# Patient Record
Sex: Female | Born: 1965 | Race: White | Hispanic: No | Marital: Married | Smoking: Never smoker
Health system: Southern US, Community
[De-identification: ages and names within clinical notes are randomized; demographics above are authoritative.]

## PROBLEM LIST (undated history)

## (undated) DIAGNOSIS — I10 Essential (primary) hypertension: Secondary | ICD-10-CM

## (undated) DIAGNOSIS — F419 Anxiety disorder, unspecified: Secondary | ICD-10-CM

## (undated) DIAGNOSIS — J309 Allergic rhinitis, unspecified: Secondary | ICD-10-CM

## (undated) DIAGNOSIS — N920 Excessive and frequent menstruation with regular cycle: Secondary | ICD-10-CM

## (undated) DIAGNOSIS — R112 Nausea with vomiting, unspecified: Secondary | ICD-10-CM

## (undated) DIAGNOSIS — L719 Rosacea, unspecified: Secondary | ICD-10-CM

## (undated) DIAGNOSIS — Z8719 Personal history of other diseases of the digestive system: Secondary | ICD-10-CM

## (undated) DIAGNOSIS — M1990 Unspecified osteoarthritis, unspecified site: Secondary | ICD-10-CM

## (undated) DIAGNOSIS — F329 Major depressive disorder, single episode, unspecified: Secondary | ICD-10-CM

## (undated) DIAGNOSIS — D259 Leiomyoma of uterus, unspecified: Secondary | ICD-10-CM

## (undated) DIAGNOSIS — E0500 Thyrotoxicosis with diffuse goiter without thyrotoxic crisis or storm: Secondary | ICD-10-CM

## (undated) DIAGNOSIS — Z9889 Other specified postprocedural states: Secondary | ICD-10-CM

## (undated) DIAGNOSIS — K219 Gastro-esophageal reflux disease without esophagitis: Secondary | ICD-10-CM

## (undated) DIAGNOSIS — K589 Irritable bowel syndrome without diarrhea: Secondary | ICD-10-CM

## (undated) DIAGNOSIS — R030 Elevated blood-pressure reading, without diagnosis of hypertension: Secondary | ICD-10-CM

## (undated) DIAGNOSIS — F32A Depression, unspecified: Secondary | ICD-10-CM

## (undated) DIAGNOSIS — F410 Panic disorder [episodic paroxysmal anxiety] without agoraphobia: Secondary | ICD-10-CM

## (undated) DIAGNOSIS — E039 Hypothyroidism, unspecified: Secondary | ICD-10-CM

## (undated) DIAGNOSIS — G479 Sleep disorder, unspecified: Secondary | ICD-10-CM

## (undated) DIAGNOSIS — B029 Zoster without complications: Secondary | ICD-10-CM

## (undated) DIAGNOSIS — Z973 Presence of spectacles and contact lenses: Secondary | ICD-10-CM

## (undated) DIAGNOSIS — J302 Other seasonal allergic rhinitis: Secondary | ICD-10-CM

## (undated) HISTORY — DX: Panic disorder (episodic paroxysmal anxiety): F41.0

## (undated) HISTORY — PX: TONSILLECTOMY: SUR1361

## (undated) HISTORY — DX: Sleep disorder, unspecified: G47.9

## (undated) HISTORY — DX: Elevated blood-pressure reading, without diagnosis of hypertension: R03.0

## (undated) HISTORY — PX: KNEE SURGERY: SHX244

## (undated) HISTORY — DX: Leiomyoma of uterus, unspecified: D25.9

## (undated) HISTORY — DX: Zoster without complications: B02.9

## (undated) HISTORY — DX: Allergic rhinitis, unspecified: J30.9

## (undated) HISTORY — DX: Irritable bowel syndrome, unspecified: K58.9

## (undated) HISTORY — DX: Rosacea, unspecified: L71.9

## (undated) HISTORY — DX: Excessive and frequent menstruation with regular cycle: N92.0

## (undated) HISTORY — PX: DIAGNOSTIC LAPAROSCOPY: SUR761

---

## 2006-07-09 ENCOUNTER — Emergency Department (HOSPITAL_COMMUNITY): Admission: EM | Admit: 2006-07-09 | Discharge: 2006-07-09 | Payer: Self-pay | Admitting: Emergency Medicine

## 2011-07-12 ENCOUNTER — Ambulatory Visit (HOSPITAL_COMMUNITY): Payer: BC Managed Care – PPO

## 2011-09-03 ENCOUNTER — Other Ambulatory Visit: Payer: Self-pay | Admitting: Orthopaedic Surgery

## 2011-09-03 DIAGNOSIS — M25529 Pain in unspecified elbow: Secondary | ICD-10-CM

## 2011-09-09 ENCOUNTER — Ambulatory Visit
Admission: RE | Admit: 2011-09-09 | Discharge: 2011-09-09 | Disposition: A | Discharge status: Home / Self Care | Payer: BC Managed Care – PPO | Source: Ambulatory Visit | Attending: Orthopaedic Surgery | Admitting: Orthopaedic Surgery

## 2011-09-09 DIAGNOSIS — M25529 Pain in unspecified elbow: Secondary | ICD-10-CM

## 2013-03-16 ENCOUNTER — Other Ambulatory Visit: Payer: Self-pay | Admitting: Obstetrics and Gynecology

## 2013-03-16 DIAGNOSIS — R928 Other abnormal and inconclusive findings on diagnostic imaging of breast: Secondary | ICD-10-CM

## 2013-04-04 ENCOUNTER — Ambulatory Visit
Admission: RE | Admit: 2013-04-04 | Discharge: 2013-04-04 | Disposition: A | Discharge status: Home / Self Care | Payer: BC Managed Care – PPO | Source: Ambulatory Visit | Attending: Obstetrics and Gynecology | Admitting: Obstetrics and Gynecology

## 2013-04-04 DIAGNOSIS — R928 Other abnormal and inconclusive findings on diagnostic imaging of breast: Secondary | ICD-10-CM

## 2015-04-23 ENCOUNTER — Emergency Department (HOSPITAL_COMMUNITY)
Admission: EM | Admit: 2015-04-23 | Discharge: 2015-04-23 | Disposition: A | Discharge status: Home / Self Care | Payer: BC Managed Care – PPO | Attending: Emergency Medicine | Admitting: Emergency Medicine

## 2015-04-23 ENCOUNTER — Emergency Department (HOSPITAL_COMMUNITY): Payer: BC Managed Care – PPO

## 2015-04-23 ENCOUNTER — Encounter (HOSPITAL_COMMUNITY): Payer: Self-pay | Admitting: Emergency Medicine

## 2015-04-23 DIAGNOSIS — Z793 Long term (current) use of hormonal contraceptives: Secondary | ICD-10-CM | POA: Insufficient documentation

## 2015-04-23 DIAGNOSIS — J0110 Acute frontal sinusitis, unspecified: Secondary | ICD-10-CM | POA: Diagnosis not present

## 2015-04-23 DIAGNOSIS — E0500 Thyrotoxicosis with diffuse goiter without thyrotoxic crisis or storm: Secondary | ICD-10-CM | POA: Insufficient documentation

## 2015-04-23 DIAGNOSIS — R079 Chest pain, unspecified: Secondary | ICD-10-CM

## 2015-04-23 DIAGNOSIS — Z79899 Other long term (current) drug therapy: Secondary | ICD-10-CM | POA: Diagnosis not present

## 2015-04-23 HISTORY — DX: Thyrotoxicosis with diffuse goiter without thyrotoxic crisis or storm: E05.00

## 2015-04-23 LAB — BASIC METABOLIC PANEL
Anion gap: 8 (ref 5–15)
BUN: 13 mg/dL (ref 6–20)
CHLORIDE: 105 mmol/L (ref 101–111)
CO2: 24 mmol/L (ref 22–32)
CREATININE: 0.87 mg/dL (ref 0.44–1.00)
Calcium: 8.7 mg/dL — ABNORMAL LOW (ref 8.9–10.3)
GFR calc Af Amer: >60 mL/min (ref >60)
GFR calc non Af Amer: >60 mL/min (ref >60)
GLUCOSE: 130 mg/dL — AB (ref 65–99)
POTASSIUM: 3.7 mmol/L (ref 3.5–5.1)
SODIUM: 137 mmol/L (ref 135–145)

## 2015-04-23 LAB — CBC
HEMATOCRIT: 41.6 % (ref 36.0–46.0)
Hemoglobin: 13.5 g/dL (ref 12.0–15.0)
MCH: 30.9 pg (ref 26.0–34.0)
MCHC: 32.5 g/dL (ref 30.0–36.0)
MCV: 95.2 fL (ref 78.0–100.0)
Platelets: 236 10*3/uL (ref 150–400)
RBC: 4.37 MIL/uL (ref 3.87–5.11)
RDW: 13.1 % (ref 11.5–15.5)
WBC: 6 10*3/uL (ref 4.0–10.5)

## 2015-04-23 LAB — I-STAT TROPONIN, ED
TROPONIN I, POC: 0 ng/mL (ref 0.00–0.08)
Troponin i, poc: 0.01 ng/mL (ref 0.00–0.08)

## 2015-04-23 LAB — D-DIMER, QUANTITATIVE (NOT AT ARMC): D-Dimer, Quant: 0.48 ug/mL-FEU (ref 0.00–0.50)

## 2015-04-23 MED ORDER — AMOXICILLIN-POT CLAVULANATE 875-125 MG PO TABS: 1.0000 | ORAL_TABLET | Freq: Two times a day (BID) | ORAL | Status: DC

## 2015-04-23 MED ORDER — IBUPROFEN 200 MG PO TABS
600.0000 mg | ORAL_TABLET | Freq: Once | ORAL | Status: AC
  Administered 2015-04-23: 600 mg via ORAL
  Filled 2015-04-23: qty 3

## 2015-04-23 NOTE — ED Provider Notes (Signed)
CSN: [id number]     Arrival date & time 04/23/15  1821 History   First MD Initiated Contact with Patient 04/23/15 1934     Chief Complaint  Patient presents with  . Chest Pain   HPI   22 YOF presents today with chest pain . Pt reports that at approx 12 pm today she experienced onset of left anterior chest pain with radiation to the back. She reports symptoms have been waxing and waning but have been persistent since onset. She denies any associated nausea, vomiting, diaphoresis, shortness of breath, or any radiation of pain. Patient describes the pain as "sore" like a pulled muscle but denies any significant physical activity.  Patient reports significant past medical history of many panic attacks over the last several months due to increased stress. She reports that she has shortness of breath, chest tightness, and rapid heart rate. She reports today symptoms do not appear similar to panic attacks with the exception of the increased heart rate.. Patient denies any significant cardiac history, she does not use any drugs, smoke, hyperlipidemia, or significant family history. Patient denies any history of DVT or PEs, does note she is taking estrogen-containing birth control. No history of lower extremity swelling or edema, prolonged immobilization, trauma, malignancy, recent surgery. Patient also notes that since Thanksgiving she's had bilateral sinus pressure and pain, with radiation into her teeth. She's had bloody rhinorrhea. History of sinus infections requiring antibiotics.   Past Medical History  Diagnosis Date  . Graves disease    Past Surgical History  Procedure Laterality Date  . Knee surgery    . Tonsillectomy     History reviewed. No pertinent family history. Social History  Substance Use Topics  . Smoking status: Never Smoker   . Smokeless tobacco: None  . Alcohol Use: No   OB History    No data available     Review of Systems  All other systems reviewed and are  negative.   Allergies  Codeine  Home Medications   Prior to Admission medications   Medication Sig Start Date End Date Taking? Authorizing Provider  ENPRESSE-28 tablet Take 1 tablet by mouth daily. 04/07/15  Yes Historical Provider, MD  levothyroxine (SYNTHROID, LEVOTHROID) 125 MCG tablet Take 125 mcg by mouth daily. 03/28/15  Yes Historical Provider, MD  amoxicillin-clavulanate (AUGMENTIN) 875-125 MG tablet Take 1 tablet by mouth every 12 (twelve) hours. 04/23/15   Okey Regal, PA-C   BP 142/100 mmHg  Pulse 78  Resp 18  SpO2 100%  LMP 04/09/2015 (Exact Date)   Physical Exam  Constitutional: She is oriented to person, place, and time. She appears well-developed and well-nourished.  HENT:  Head: Normocephalic and atraumatic.  Eyes: Conjunctivae are normal. Pupils are equal, round, and reactive to light. Right eye exhibits no discharge. Left eye exhibits no discharge. No scleral icterus.  Neck: Normal range of motion. No JVD present. No tracheal deviation present.  Cardiovascular: Regular rhythm, normal heart sounds and intact distal pulses.  Exam reveals no gallop and no friction rub.   No murmur heard. Pulmonary/Chest: Effort normal and breath sounds normal. No stridor. No respiratory distress. She has no wheezes. She has no rales. She exhibits no tenderness.  Chest atraumatic, nontender to palpation. Radial pulses 2+ and equal bilateral. Pedal pulses 2+ equal bilateral  Abdominal: Soft. She exhibits no distension and no mass. There is no tenderness. There is no rebound and no guarding.  Musculoskeletal: Normal range of motion. She exhibits no edema or tenderness.  Neurological: She is alert and oriented to person, place, and time. Coordination normal.  Skin: Skin is warm and dry. No rash noted. No erythema. No pallor.  Psychiatric: She has a normal mood and affect. Her behavior is normal. Judgment and thought content normal.  Nursing note and vitals reviewed.   ED Course   Procedures (including critical care time) Labs Review Labs Reviewed  BASIC METABOLIC PANEL - Abnormal; Notable for the following:    Glucose, Bld 130 (*)    Calcium 8.7 (*)    All other components within normal limits  CBC  D-DIMER, QUANTITATIVE (NOT AT [facility])  Randolm Idol, ED  Randolm Idol, ED    Imaging Review Dg Chest 2 View  04/23/2015  CLINICAL DATA:  Chest pain EXAM: CHEST  2 VIEW COMPARISON:  None. FINDINGS: The heart size and mediastinal contours are within normal limits. Both lungs are clear. The visualized skeletal structures are unremarkable. IMPRESSION: No active cardiopulmonary disease. Electronically Signed   By: Jerilynn Mages.  Shick M.D.   On: 04/23/2015 20:22   I have personally reviewed and evaluated these images and lab results as part of my medical decision-making.   EKG Interpretation   Date/Time:  Tuesday April 23 2015 18:35:35 EST Ventricular Rate:  89 PR Interval:  148 QRS Duration: 92 QT Interval:  388 QTC Calculation: 472 R Axis:   -65 Text Interpretation:  Sinus rhythm Left anterior fascicular block Probable  anteroseptal infarct, old Baseline wander in lead(s) V4 V6 No previous  tracing Confirmed by KNOTT MD, DANIEL [id number]) on 04/23/2015 10:45:25 PM      MDM   Final diagnoses:  Chest pain, unspecified chest pain type  Acute frontal sinusitis, recurrence not specified    Labs: I-STAT troponin 2, d-dimer, BMP, CBC- no significant findings  Imaging: DG chest 2 view- no acute cardiopulmonary disease  Consults:  Therapeutics: Ibuprofen  Discharge Meds: Augmentin  Assessment/Plan: Patient presents with chest pain. She initially had an elevated heart rate and blood pressure elevated over her baseline. Patient has no significant past cardiac history with a heart score of 0. She had repeat troponins that showed no elevation. EKG was normal. Question for pulmonary embolism with elevated heart rate and chest pain, d-dimer was negative here.  Patient had 100% oxygenation, respirations 18, with a reduction of her heart rate to 78 at the time of discharge. Low suspicion for any acute or life-threatening etiology based on physical exam, vital signs, laboratory data. Patient was treated with ibuprofen here with a minor reduction in chest pain. I suspect musculoskeletal etiology. Patient also poor she's been under more stress recently and believes that this could be contributing to her chest discomfort. Patient will be discharged home with instructions to use antibiotics as needed for sinusitis, ibuprofen as needed for chest discomfort, and monitor for new or worsening signs or symptoms. She was instructed to return immediately to the emergency room if any new or worsening signs symptoms present, she verbalized understanding and agreement for today's plan.        Okey Regal, PA-C 04/24/15 0222  Leo Grosser, MD 04/24/15 [phone]

## 2015-04-23 NOTE — ED Notes (Addendum)
Pt reports left sided chest pain radiating to left upper back/shoulder/down the left arm around 1300. Also reports a "flushing sensation" around the left ear and underneath the jaw but denies specific pain. Says, "it feels like my ear is plugged and it's like a fuzzy sensation. It feels like the allergic flush I had up my neck when I had an allergy to an allergy shot." Pain in chest described as "pressure" but also says, "it feels like I pulled a muscle but I haven't done anything different." Denies recent trauma/injury. Reports small, "mini" panic attacks over the past few months that come and go. Says she didn't feel that way with this, "only the pain." Denies N/V/D/dizziness. Endorses decreased appetite. RR even/unlabored. Hx Graves Disease. Takes Levothyroxine. Denies new medications started. Father passed away at 63 yo of a massive MI, brother has heart disease. Reports increase stress recently.

## 2015-04-23 NOTE — ED Notes (Signed)
Jeff PA at bedside.  

## 2015-04-23 NOTE — Discharge Instructions (Signed)
Please monitor for any new or worsening signs or symptoms, if any present please return immediately to the emergency room for further evaluation and management. Please use Tylenol or ibuprofen as needed for pain. Please follow-up with her primary care provider if symptoms continue to persist.

## 2016-05-18 HISTORY — PX: COLONOSCOPY: SHX174

## 2018-04-11 NOTE — H&P (Signed)
NAME: Crystal Mcintosh, Crystal Mcintosh MEDICAL RECORD [id number] ACCOUNT [id number] DATE OF BIRTH:[birth year] FACILITY: [facility] LOCATION:  PHYSICIAN:Sheza Strickland Garry Heater, MD  HISTORY AND PHYSICAL  DATE OF ADMISSION:  05/05/2018  CHIEF COMPLAINT:  Dysmenorrhea, menorrhagia secondary to leiomyoma.  HISTORY OF PRESENT ILLNESS:  A 52 year old G1 P0 currently unimpressive has noted over the last 6 months worsening cramps and heavier menstrual flow.  Presented to the office 09/19 for [facility] that showed a 4.3 cm and several smaller fibroids, 1 abutting the  endometrium that appeared to be 50% within the cavity on saline infusion.  She presents at this time for LAVH and bilateral salpingectomy.  This procedure including specific risks regarding bleeding, infection, wound infection, phlebitis, transfusion,  the possible need to complete the surgery by open technique, along with her expected recovery time were discussed, which she understands and accepts.  ALLERGIES:  CODEINE, SHELLFISH, TREE NUTS.  CURRENT MEDICATIONS:  Levothyroxine, citalopram, Allegra-D, Flonase.  PAST SURGICAL HISTORY:  She has had a right knee surgery, elbow tendon repair, laparoscopy for endometriosis 1999.  FAMILY HISTORY:  Significant for heart disease, asthma, anemia, gallbladder disease, diabetes, hypertension, breast and lung cancer.  SOCIAL HISTORY:  Denies alcohol, tobacco or drug use.  She is married.  PHYSICAL EXAMINATION: VITAL SIGNS:  Temperature 98.2, blood pressure 120/78. HEENT:  Unremarkable. NECK:  Supple, without masses. LUNGS:  Clear. HEART:  Regular rate and rhythm without murmurs, rubs or gallops. BREASTS:  Without masses. ABDOMEN:  Soft, flat, nontender. PELVIC:  Vulva, vagina, cervix normal.  Uterus midposition, normal size, mobile.  Adnexa negative. EXTREMITIES:  Unremarkable.  NEUROLOGIC:  Unremarkable.  IMPRESSION:  Leiomyoma with menorrhagia and dysmenorrhea.  PLAN:  LAVH, bilateral salpingectomy.   Procedure and risks discussed as above.  LN/NUANCE  D:04/11/2018 T:04/11/2018 JOB:[id number]

## 2018-04-11 NOTE — H&P (Signed)
Crystal Mcintosh  DICTATION # [id number] CSN# [id number]   Margarette Asal, MD 04/11/2018 9:54 AM

## 2018-04-29 ENCOUNTER — Encounter (HOSPITAL_COMMUNITY): Payer: Self-pay

## 2018-04-29 ENCOUNTER — Encounter (HOSPITAL_COMMUNITY)
Admission: RE | Admit: 2018-04-29 | Discharge: 2018-04-29 | Disposition: A | Discharge status: Home / Self Care | Payer: BC Managed Care – PPO | Source: Ambulatory Visit | Attending: Obstetrics and Gynecology | Admitting: Obstetrics and Gynecology

## 2018-04-29 ENCOUNTER — Other Ambulatory Visit: Payer: Self-pay

## 2018-04-29 DIAGNOSIS — Z01818 Encounter for other preprocedural examination: Secondary | ICD-10-CM | POA: Insufficient documentation

## 2018-04-29 DIAGNOSIS — N920 Excessive and frequent menstruation with regular cycle: Secondary | ICD-10-CM | POA: Insufficient documentation

## 2018-04-29 DIAGNOSIS — D259 Leiomyoma of uterus, unspecified: Secondary | ICD-10-CM | POA: Insufficient documentation

## 2018-04-29 HISTORY — DX: Presence of spectacles and contact lenses: Z97.3

## 2018-04-29 HISTORY — DX: Hypothyroidism, unspecified: E03.9

## 2018-04-29 HISTORY — DX: Other seasonal allergic rhinitis: J30.2

## 2018-04-29 HISTORY — DX: Depression, unspecified: F32.A

## 2018-04-29 HISTORY — DX: Major depressive disorder, single episode, unspecified: F32.9

## 2018-04-29 HISTORY — DX: Other specified postprocedural states: Z98.890

## 2018-04-29 HISTORY — DX: Essential (primary) hypertension: I10

## 2018-04-29 HISTORY — DX: Unspecified osteoarthritis, unspecified site: M19.90

## 2018-04-29 HISTORY — DX: Nausea with vomiting, unspecified: R11.2

## 2018-04-29 HISTORY — DX: Gastro-esophageal reflux disease without esophagitis: K21.9

## 2018-04-29 HISTORY — DX: Personal history of other diseases of the digestive system: Z87.19

## 2018-04-29 HISTORY — DX: Anxiety disorder, unspecified: F41.9

## 2018-04-29 LAB — CBC
HEMATOCRIT: 44.2 % (ref 36.0–46.0)
HEMOGLOBIN: 15 g/dL (ref 12.0–15.0)
MCH: 32.8 pg (ref 26.0–34.0)
MCHC: 33.9 g/dL (ref 30.0–36.0)
MCV: 96.7 fL (ref 80.0–100.0)
Platelets: 270 10*3/uL (ref 150–400)
RBC: 4.57 MIL/uL (ref 3.87–5.11)
RDW: 12.8 % (ref 11.5–15.5)
WBC: 6.4 10*3/uL (ref 4.0–10.5)
nRBC: 0 % (ref 0.0–0.2)

## 2018-04-29 LAB — BASIC METABOLIC PANEL
ANION GAP: 6 (ref 5–15)
BUN: 12 mg/dL (ref 6–20)
CALCIUM: 9 mg/dL (ref 8.9–10.3)
CO2: 27 mmol/L (ref 22–32)
CREATININE: 0.95 mg/dL (ref 0.44–1.00)
Chloride: 104 mmol/L (ref 98–111)
GFR calc Af Amer: >60 mL/min (ref >60)
GFR calc non Af Amer: >60 mL/min (ref >60)
Glucose, Bld: 102 mg/dL — ABNORMAL HIGH (ref 70–99)
Potassium: 3.7 mmol/L (ref 3.5–5.1)
SODIUM: 137 mmol/L (ref 135–145)

## 2018-04-29 LAB — TYPE AND SCREEN
ABO/RH(D): O POS
ANTIBODY SCREEN: NEGATIVE

## 2018-04-29 LAB — ABO/RH: ABO/RH(D): O POS

## 2018-04-29 NOTE — Patient Instructions (Addendum)
Your procedure is scheduled WN:[id number], 12/19  Enter through the Main Entrance of [facility] at: 6 am  Pick up the phone at the desk and dial [phone].  Call this number if you have problems the morning of surgery: [phone].  Remember: Do NOT eat food or Do NOT drink clear liquids (including water) after midnight Wednesday.  Take these medicines the morning of surgery with a SIP OF WATER: citalopram, allegra, levothyroxine and ok to use flonase nasal spray if needed.  Brush your teeth on the day of surgery.  Stop herbal medications, vitamin supplements, Ibuprofen/NSAIDS at this time.  Do NOT wear jewelry (body piercing), metal hair clips/bobby pins, make-up, or nail polish. Do NOT wear lotions, powders, or perfumes.  You may wear deoderant. Do NOT shave for 48 hours prior to surgery. Do NOT bring valuables to the hospital. Contacts may not be worn into surgery.  Leave suitcase in car.  After surgery it may be brought to your room.  For patients admitted to the hospital, checkout time is 11:00 AM the day of discharge. Have a responsible adult drive you home and stay with you for 24 hours after your procedure.  Home with Husband Evette Doffing cell [phone].

## 2018-05-04 NOTE — Anesthesia Preprocedure Evaluation (Addendum)
Anesthesia Evaluation  Patient identified by MRN, date of birth, ID band Patient awake    Reviewed: Allergy & Precautions, NPO status , Patient's Chart, lab work & pertinent test results  History of Anesthesia Complications (+) PONV and history of anesthetic complications  Airway Mallampati: I  TM Distance: >3 FB Neck ROM: Full    Dental no notable dental hx.    Pulmonary neg pulmonary ROS,    Pulmonary exam normal breath sounds clear to auscultation       Cardiovascular hypertension, Pt. on medications Normal cardiovascular exam Rhythm:Regular Rate:Normal     Neuro/Psych PSYCHIATRIC DISORDERS Anxiety Depression negative neurological ROS     GI/Hepatic Neg liver ROS, GERD  Controlled,History of IBS   Endo/Other  Hypothyroidism   Renal/GU negative Renal ROS     Musculoskeletal negative musculoskeletal ROS (+)   Abdominal   Peds  Hematology negative hematology ROS (+)   Anesthesia Other Findings Fibroids Menorrhagia  Reproductive/Obstetrics                            Anesthesia Physical Anesthesia Plan  ASA: III  Anesthesia Plan: General   Post-op Pain Management:    Induction: Intravenous  PONV Risk Score and Plan: 4 or greater and Scopolamine patch - Pre-op, Midazolam, Dexamethasone, Ondansetron, Treatment may vary due to age or medical condition and Propofol infusion  Airway Management Planned: Oral ETT  Additional Equipment:   Intra-op Plan:   Post-operative Plan: Extubation in OR  Informed Consent: I have reviewed the patients History and Physical, chart, labs and discussed the procedure including the risks, benefits and alternatives for the proposed anesthesia with the patient or authorized representative who has indicated his/her understanding and acceptance.   Dental advisory given  Plan Discussed with: CRNA  Anesthesia Plan Comments:        Anesthesia  Quick Evaluation

## 2018-05-05 ENCOUNTER — Observation Stay (HOSPITAL_COMMUNITY): Payer: BC Managed Care – PPO | Admitting: Anesthesiology

## 2018-05-05 ENCOUNTER — Encounter (HOSPITAL_COMMUNITY)
Admission: RE | Disposition: A | Discharge status: Home / Self Care | Payer: Self-pay | Source: Ambulatory Visit | Attending: Obstetrics and Gynecology

## 2018-05-05 ENCOUNTER — Other Ambulatory Visit: Payer: Self-pay

## 2018-05-05 ENCOUNTER — Encounter (HOSPITAL_COMMUNITY): Payer: Self-pay | Admitting: Emergency Medicine

## 2018-05-05 ENCOUNTER — Observation Stay (HOSPITAL_COMMUNITY)
Admission: RE | Admit: 2018-05-05 | Discharge: 2018-05-06 | Disposition: A | Discharge status: Home / Self Care | Payer: BC Managed Care – PPO | Source: Ambulatory Visit | Attending: Obstetrics and Gynecology | Admitting: Obstetrics and Gynecology

## 2018-05-05 DIAGNOSIS — F419 Anxiety disorder, unspecified: Secondary | ICD-10-CM | POA: Insufficient documentation

## 2018-05-05 DIAGNOSIS — Z885 Allergy status to narcotic agent status: Secondary | ICD-10-CM | POA: Insufficient documentation

## 2018-05-05 DIAGNOSIS — Z8379 Family history of other diseases of the digestive system: Secondary | ICD-10-CM | POA: Insufficient documentation

## 2018-05-05 DIAGNOSIS — K219 Gastro-esophageal reflux disease without esophagitis: Secondary | ICD-10-CM | POA: Diagnosis not present

## 2018-05-05 DIAGNOSIS — Z825 Family history of asthma and other chronic lower respiratory diseases: Secondary | ICD-10-CM | POA: Insufficient documentation

## 2018-05-05 DIAGNOSIS — Z803 Family history of malignant neoplasm of breast: Secondary | ICD-10-CM | POA: Insufficient documentation

## 2018-05-05 DIAGNOSIS — N946 Dysmenorrhea, unspecified: Secondary | ICD-10-CM | POA: Insufficient documentation

## 2018-05-05 DIAGNOSIS — D259 Leiomyoma of uterus, unspecified: Secondary | ICD-10-CM | POA: Insufficient documentation

## 2018-05-05 DIAGNOSIS — Z91013 Allergy to seafood: Secondary | ICD-10-CM | POA: Diagnosis not present

## 2018-05-05 DIAGNOSIS — Z791 Long term (current) use of non-steroidal anti-inflammatories (NSAID): Secondary | ICD-10-CM | POA: Diagnosis not present

## 2018-05-05 DIAGNOSIS — Z806 Family history of leukemia: Secondary | ICD-10-CM | POA: Insufficient documentation

## 2018-05-05 DIAGNOSIS — N920 Excessive and frequent menstruation with regular cycle: Secondary | ICD-10-CM | POA: Diagnosis present

## 2018-05-05 DIAGNOSIS — Z79899 Other long term (current) drug therapy: Secondary | ICD-10-CM | POA: Insufficient documentation

## 2018-05-05 DIAGNOSIS — Z833 Family history of diabetes mellitus: Secondary | ICD-10-CM | POA: Insufficient documentation

## 2018-05-05 DIAGNOSIS — N800 Endometriosis of uterus: Secondary | ICD-10-CM | POA: Diagnosis not present

## 2018-05-05 DIAGNOSIS — K589 Irritable bowel syndrome without diarrhea: Secondary | ICD-10-CM | POA: Diagnosis not present

## 2018-05-05 DIAGNOSIS — Z91018 Allergy to other foods: Secondary | ICD-10-CM | POA: Insufficient documentation

## 2018-05-05 DIAGNOSIS — N870 Mild cervical dysplasia: Secondary | ICD-10-CM | POA: Diagnosis not present

## 2018-05-05 DIAGNOSIS — E039 Hypothyroidism, unspecified: Secondary | ICD-10-CM | POA: Diagnosis not present

## 2018-05-05 DIAGNOSIS — Z8249 Family history of ischemic heart disease and other diseases of the circulatory system: Secondary | ICD-10-CM | POA: Insufficient documentation

## 2018-05-05 DIAGNOSIS — F329 Major depressive disorder, single episode, unspecified: Secondary | ICD-10-CM | POA: Diagnosis not present

## 2018-05-05 DIAGNOSIS — D219 Benign neoplasm of connective and other soft tissue, unspecified: Secondary | ICD-10-CM | POA: Diagnosis present

## 2018-05-05 HISTORY — PX: LAPAROSCOPIC VAGINAL HYSTERECTOMY WITH SALPINGECTOMY: SHX6680

## 2018-05-05 LAB — PREGNANCY, URINE: Preg Test, Ur: NEGATIVE

## 2018-05-05 SURGERY — HYSTERECTOMY, VAGINAL, LAPAROSCOPY-ASSISTED, WITH SALPINGECTOMY
Anesthesia: General | Site: Abdomen | Laterality: Bilateral

## 2018-05-05 MED ORDER — ONDANSETRON HCL 4 MG/2ML IJ SOLN: 4.0000 mg | Freq: Four times a day (QID) | INTRAMUSCULAR | Status: DC | PRN

## 2018-05-05 MED ORDER — PHENYLEPHRINE 40 MCG/ML (10ML) SYRINGE FOR IV PUSH (FOR BLOOD PRESSURE SUPPORT)
PREFILLED_SYRINGE | INTRAVENOUS | Status: AC
  Filled 2018-05-05: qty 10

## 2018-05-05 MED ORDER — DEXAMETHASONE SODIUM PHOSPHATE 4 MG/ML IJ SOLN
INTRAMUSCULAR | Status: AC
  Filled 2018-05-05: qty 1

## 2018-05-05 MED ORDER — LIDOCAINE HCL (CARDIAC) PF 100 MG/5ML IV SOSY
PREFILLED_SYRINGE | INTRAVENOUS | Status: DC | PRN
  Administered 2018-05-05: 50 mg via INTRAVENOUS

## 2018-05-05 MED ORDER — FLUTICASONE PROPIONATE 50 MCG/ACT NA SUSP
1.0000 | Freq: Two times a day (BID) | NASAL | Status: DC
  Filled 2018-05-05: qty 16

## 2018-05-05 MED ORDER — SODIUM CHLORIDE 0.9% FLUSH: 9.0000 mL | INTRAVENOUS | Status: DC | PRN

## 2018-05-05 MED ORDER — BUPIVACAINE HCL (PF) 0.25 % IJ SOLN
INTRAMUSCULAR | Status: AC
  Filled 2018-05-05: qty 30

## 2018-05-05 MED ORDER — ACETAMINOPHEN 160 MG/5ML PO SOLN: 960.0000 mg | Freq: Once | ORAL | Status: AC

## 2018-05-05 MED ORDER — DIPHENHYDRAMINE HCL 12.5 MG/5ML PO ELIX: 12.5000 mg | ORAL_SOLUTION | Freq: Four times a day (QID) | ORAL | Status: DC | PRN

## 2018-05-05 MED ORDER — OXYCODONE-ACETAMINOPHEN 5-325 MG PO TABS
1.0000 | ORAL_TABLET | ORAL | Status: DC | PRN
  Administered 2018-05-05: 1 via ORAL
  Filled 2018-05-05: qty 1

## 2018-05-05 MED ORDER — ACETAMINOPHEN 500 MG PO TABS
ORAL_TABLET | ORAL | Status: AC
  Filled 2018-05-05: qty 2

## 2018-05-05 MED ORDER — NALOXONE HCL 0.4 MG/ML IJ SOLN: 0.4000 mg | INTRAMUSCULAR | Status: DC | PRN

## 2018-05-05 MED ORDER — LEVOTHYROXINE SODIUM 150 MCG PO TABS
150.0000 ug | ORAL_TABLET | Freq: Every day | ORAL | Status: DC
  Administered 2018-05-06: 150 ug via ORAL
  Filled 2018-05-05: qty 1

## 2018-05-05 MED ORDER — FAMOTIDINE 20 MG PO TABS
20.0000 mg | ORAL_TABLET | Freq: Once | ORAL | Status: AC
  Administered 2018-05-05: 20 mg via ORAL

## 2018-05-05 MED ORDER — CITALOPRAM HYDROBROMIDE 10 MG PO TABS
10.0000 mg | ORAL_TABLET | Freq: Every day | ORAL | Status: DC
  Filled 2018-05-05 (×2): qty 1

## 2018-05-05 MED ORDER — FENTANYL CITRATE (PF) 100 MCG/2ML IJ SOLN
INTRAMUSCULAR | Status: DC | PRN
  Administered 2018-05-05 (×3): 50 ug via INTRAVENOUS
  Administered 2018-05-05: 100 ug via INTRAVENOUS

## 2018-05-05 MED ORDER — MIDAZOLAM HCL 2 MG/2ML IJ SOLN
INTRAMUSCULAR | Status: AC
  Filled 2018-05-05: qty 2

## 2018-05-05 MED ORDER — SODIUM CHLORIDE 0.9 % IV SOLN
2.0000 g | INTRAVENOUS | Status: AC
  Administered 2018-05-05: 2 g via INTRAVENOUS

## 2018-05-05 MED ORDER — PROMETHAZINE HCL 25 MG/ML IJ SOLN: 6.2500 mg | INTRAMUSCULAR | Status: DC | PRN

## 2018-05-05 MED ORDER — SODIUM CHLORIDE 0.9 % IV SOLN
INTRAVENOUS | Status: AC
  Filled 2018-05-05: qty 2

## 2018-05-05 MED ORDER — KETOROLAC TROMETHAMINE 30 MG/ML IJ SOLN
INTRAMUSCULAR | Status: AC
  Filled 2018-05-05: qty 1

## 2018-05-05 MED ORDER — LACTATED RINGERS IV SOLN
INTRAVENOUS | Status: DC
  Administered 2018-05-05 (×2): via INTRAVENOUS

## 2018-05-05 MED ORDER — ACETAMINOPHEN 500 MG PO TABS
1000.0000 mg | ORAL_TABLET | Freq: Once | ORAL | Status: AC
  Administered 2018-05-05: 1000 mg via ORAL

## 2018-05-05 MED ORDER — SODIUM CHLORIDE (PF) 0.9 % IJ SOLN
INTRAMUSCULAR | Status: AC
  Filled 2018-05-05: qty 10

## 2018-05-05 MED ORDER — DIPHENHYDRAMINE HCL 50 MG/ML IJ SOLN: 12.5000 mg | Freq: Four times a day (QID) | INTRAMUSCULAR | Status: DC | PRN

## 2018-05-05 MED ORDER — SCOPOLAMINE 1 MG/3DAYS TD PT72
MEDICATED_PATCH | TRANSDERMAL | Status: AC
  Administered 2018-05-05: 1.5 mg via TRANSDERMAL
  Filled 2018-05-05: qty 1

## 2018-05-05 MED ORDER — PROPOFOL 10 MG/ML IV BOLUS
INTRAVENOUS | Status: AC
  Filled 2018-05-05: qty 20

## 2018-05-05 MED ORDER — ROCURONIUM BROMIDE 100 MG/10ML IV SOLN
INTRAVENOUS | Status: DC | PRN
  Administered 2018-05-05: 50 mg via INTRAVENOUS

## 2018-05-05 MED ORDER — PROPOFOL 500 MG/50ML IV EMUL
INTRAVENOUS | Status: DC | PRN
  Administered 2018-05-05: 75 ug/kg/min via INTRAVENOUS

## 2018-05-05 MED ORDER — ONDANSETRON HCL 4 MG/2ML IJ SOLN
INTRAMUSCULAR | Status: DC | PRN
  Administered 2018-05-05: 4 mg via INTRAVENOUS

## 2018-05-05 MED ORDER — KETOROLAC TROMETHAMINE 30 MG/ML IJ SOLN
INTRAMUSCULAR | Status: DC | PRN
  Administered 2018-05-05: 30 mg via INTRAVENOUS

## 2018-05-05 MED ORDER — ONDANSETRON HCL 4 MG/2ML IJ SOLN
INTRAMUSCULAR | Status: AC
  Filled 2018-05-05: qty 2

## 2018-05-05 MED ORDER — ONDANSETRON HCL 4 MG PO TABS: 4.0000 mg | ORAL_TABLET | Freq: Four times a day (QID) | ORAL | Status: DC | PRN

## 2018-05-05 MED ORDER — SUGAMMADEX SODIUM 200 MG/2ML IV SOLN
INTRAVENOUS | Status: DC | PRN
  Administered 2018-05-05: 150 mg via INTRAVENOUS

## 2018-05-05 MED ORDER — BUPIVACAINE HCL (PF) 0.25 % IJ SOLN
INTRAMUSCULAR | Status: DC | PRN
  Administered 2018-05-05: 7 mL

## 2018-05-05 MED ORDER — PROPOFOL 10 MG/ML IV BOLUS
INTRAVENOUS | Status: DC | PRN
  Administered 2018-05-05: 150 mg via INTRAVENOUS

## 2018-05-05 MED ORDER — DEXTROSE IN LACTATED RINGERS 5 % IV SOLN: INTRAVENOUS | Status: DC

## 2018-05-05 MED ORDER — SUGAMMADEX SODIUM 200 MG/2ML IV SOLN
INTRAVENOUS | Status: AC
  Filled 2018-05-05: qty 2

## 2018-05-05 MED ORDER — DEXAMETHASONE SODIUM PHOSPHATE 10 MG/ML IJ SOLN
INTRAMUSCULAR | Status: AC
  Filled 2018-05-05: qty 1

## 2018-05-05 MED ORDER — GABAPENTIN 300 MG PO CAPS
300.0000 mg | ORAL_CAPSULE | Freq: Two times a day (BID) | ORAL | Status: DC
  Filled 2018-05-05 (×4): qty 1

## 2018-05-05 MED ORDER — DEXAMETHASONE SODIUM PHOSPHATE 10 MG/ML IJ SOLN
INTRAMUSCULAR | Status: DC | PRN
  Administered 2018-05-05: 10 mg via INTRAVENOUS

## 2018-05-05 MED ORDER — SCOPOLAMINE 1 MG/3DAYS TD PT72
1.0000 | MEDICATED_PATCH | Freq: Once | TRANSDERMAL | Status: DC
  Administered 2018-05-05: 1.5 mg via TRANSDERMAL

## 2018-05-05 MED ORDER — HYDROMORPHONE 1 MG/ML IV SOLN
INTRAVENOUS | Status: DC
  Administered 2018-05-05: 30 mg via INTRAVENOUS
  Administered 2018-05-05: 0 mg via INTRAVENOUS
  Filled 2018-05-05: qty 30

## 2018-05-05 MED ORDER — LIDOCAINE HCL (PF) 1 % IJ SOLN
INTRAMUSCULAR | Status: AC
  Filled 2018-05-05: qty 5

## 2018-05-05 MED ORDER — MORPHINE SULFATE 2 MG/ML IV SOLN
INTRAVENOUS | Status: DC
  Filled 2018-05-05: qty 30

## 2018-05-05 MED ORDER — ONDANSETRON HCL 4 MG/2ML IJ SOLN
4.0000 mg | Freq: Four times a day (QID) | INTRAMUSCULAR | Status: DC | PRN
  Administered 2018-05-05: 4 mg via INTRAVENOUS
  Filled 2018-05-05: qty 2

## 2018-05-05 MED ORDER — SODIUM CHLORIDE (PF) 0.9 % IJ SOLN
INTRAMUSCULAR | Status: DC | PRN
  Administered 2018-05-05: 10 mL

## 2018-05-05 MED ORDER — FAMOTIDINE 20 MG PO TABS
ORAL_TABLET | ORAL | Status: AC
  Filled 2018-05-05: qty 1

## 2018-05-05 MED ORDER — MENTHOL 3 MG MT LOZG: 1.0000 | LOZENGE | OROMUCOSAL | Status: DC | PRN

## 2018-05-05 MED ORDER — HYDROCHLOROTHIAZIDE 25 MG PO TABS: 12.5000 mg | ORAL_TABLET | Freq: Every day | ORAL | Status: DC

## 2018-05-05 MED ORDER — PHENYLEPHRINE HCL 10 MG/ML IJ SOLN
INTRAMUSCULAR | Status: DC | PRN
  Administered 2018-05-05: 40 ug via INTRAVENOUS
  Administered 2018-05-05 (×2): 80 ug via INTRAVENOUS

## 2018-05-05 MED ORDER — HYDROMORPHONE 1 MG/ML IV SOLN: INTRAVENOUS | Status: DC

## 2018-05-05 MED ORDER — HYDROMORPHONE HCL 1 MG/ML IJ SOLN: 0.2500 mg | INTRAMUSCULAR | Status: DC | PRN

## 2018-05-05 MED ORDER — FENTANYL CITRATE (PF) 250 MCG/5ML IJ SOLN
INTRAMUSCULAR | Status: AC
  Filled 2018-05-05: qty 5

## 2018-05-05 MED ORDER — MIDAZOLAM HCL 2 MG/2ML IJ SOLN
INTRAMUSCULAR | Status: DC | PRN
  Administered 2018-05-05: 2 mg via INTRAVENOUS

## 2018-05-05 SURGICAL SUPPLY — 43 items
CABLE HIGH FREQUENCY MONO STRZ (ELECTRODE) IMPLANT
CATH ROBINSON RED A/P 16FR (CATHETERS) ×3 IMPLANT
CONT PATH 16OZ SNAP LID 3702 (MISCELLANEOUS) ×3 IMPLANT
COVER BACK TABLE 60X90IN (DRAPES) ×3 IMPLANT
COVER MAYO STAND STRL (DRAPES) ×3 IMPLANT
DECANTER SPIKE VIAL GLASS SM (MISCELLANEOUS) IMPLANT
DERMABOND ADVANCED (GAUZE/BANDAGES/DRESSINGS) ×2
DERMABOND ADVANCED .7 DNX12 (GAUZE/BANDAGES/DRESSINGS) ×1 IMPLANT
DRSG OPSITE POSTOP 3X4 (GAUZE/BANDAGES/DRESSINGS) ×2 IMPLANT
DURAPREP 26ML APPLICATOR (WOUND CARE) ×3 IMPLANT
ELECT REM PT RETURN 9FT ADLT (ELECTROSURGICAL) ×3
ELECTRODE REM PT RTRN 9FT ADLT (ELECTROSURGICAL) ×1 IMPLANT
FORMULA 20CAL 3 OZ MEAD (FORMULA) ×2 IMPLANT
GLOVE BIO SURGEON STRL SZ7 (GLOVE) ×6 IMPLANT
GLOVE BIOGEL PI IND STRL 6.5 (GLOVE) ×1 IMPLANT
GLOVE BIOGEL PI IND STRL 7.0 (GLOVE) ×2 IMPLANT
GLOVE BIOGEL PI INDICATOR 6.5 (GLOVE) ×2
GLOVE BIOGEL PI INDICATOR 7.0 (GLOVE) ×4
LEGGING LITHOTOMY PAIR STRL (DRAPES) ×3 IMPLANT
LIGASURE IMPACT 36 18CM CVD LR (INSTRUMENTS) ×3 IMPLANT
NEEDLE INSUFFLATION 120MM (ENDOMECHANICALS) ×3 IMPLANT
NS IRRIG 1000ML POUR BTL (IV SOLUTION) ×3 IMPLANT
PACK LAVH (CUSTOM PROCEDURE TRAY) ×3 IMPLANT
PACK ROBOTIC GOWN (GOWN DISPOSABLE) ×3 IMPLANT
PACK TRENDGUARD 450 HYBRID PRO (MISCELLANEOUS) IMPLANT
PROTECTOR NERVE ULNAR (MISCELLANEOUS) ×6 IMPLANT
SEALER TISSUE G2 CVD JAW 45CM (ENDOMECHANICALS) ×3 IMPLANT
SET IRRIG TUBING LAPAROSCOPIC (IRRIGATION / IRRIGATOR) IMPLANT
SLEEVE XCEL OPT CAN 5 100 (ENDOMECHANICALS) ×3 IMPLANT
SUT MON AB 2-0 CT1 36 (SUTURE) ×6 IMPLANT
SUT VIC AB 0 CT1 18XCR BRD8 (SUTURE) ×1 IMPLANT
SUT VIC AB 0 CT1 36 (SUTURE) ×3 IMPLANT
SUT VIC AB 0 CT1 8-18 (SUTURE) ×2
SUT VICRYL 0 TIES 12 18 (SUTURE) ×3 IMPLANT
SUT VICRYL 4-0 PS2 18IN ABS (SUTURE) ×3 IMPLANT
SYRINGE 60CC LL (MISCELLANEOUS) ×2 IMPLANT
TOWEL OR 17X24 6PK STRL BLUE (TOWEL DISPOSABLE) ×6 IMPLANT
TRAY FOLEY W/BAG SLVR 14FR (SET/KITS/TRAYS/PACK) ×3 IMPLANT
TRENDGUARD 450 HYBRID PRO PACK (MISCELLANEOUS)
TROCAR OPTI TIP 5M 100M (ENDOMECHANICALS) ×3 IMPLANT
TROCAR XCEL DIL TIP R 11M (ENDOMECHANICALS) ×3 IMPLANT
TUBING INSUF HEATED (TUBING) ×3 IMPLANT
WARMER LAPAROSCOPE (MISCELLANEOUS) ×3 IMPLANT

## 2018-05-05 NOTE — Anesthesia Postprocedure Evaluation (Signed)
Anesthesia Post Note  Patient: Crystal Mcintosh  Procedure(s) Performed: LAPAROSCOPIC ASSISTED VAGINAL HYSTERECTOMY WITH SALPINGECTOMY (Bilateral Abdomen)     Patient location during evaluation: PACU Anesthesia Type: General Level of consciousness: awake and alert Pain management: pain level controlled Vital Signs Assessment: post-procedure vital signs reviewed and stable Respiratory status: spontaneous breathing, nonlabored ventilation, respiratory function stable and patient connected to nasal cannula oxygen Cardiovascular status: blood pressure returned to baseline and stable Postop Assessment: no apparent nausea or vomiting Anesthetic complications: no    Last Vitals:  Vitals:   05/05/18 1104 05/05/18 1140  BP:  140/88  Pulse:  71  Resp: 17 17  Temp:  37 C  SpO2:  96%    Last Pain:  Vitals:   05/05/18 1230  TempSrc:   PainSc: 1    Pain Goal:                 Karyl Kinnier Tatia Petrucci

## 2018-05-05 NOTE — Op Note (Signed)
Preoperative diagnosis: Menorrhagia, history leiomyoma and endometriosis.  Postoperative diagnosis: Same  Procedure: LAVH, bilateral salpingectomy  Surgeon: Matthew Saras  Assistant: Royston Sinner  EBL: 100 cc  Procedure and findings:  The patient was taken to the operating room after an adequate level of general anesthesia was obtained with the patient's legs in stirrups the abdomen perineum and vagina were prepped and draped.  Bladder was drained clear urine, EUA was carried out uterus normal size at upper limit normal size mobile adnexa negative.  Hulka tenaculum was positioned.  This was done after appropriate timeouts were taken.  Attention directed to the abdomen  The subumbilical area was infiltrated with quarter percent Marcaine plain a small incision was made and the varies needle was introduced without difficulty.  Its intra-abdominal position was verified by pressure water testing.  After 2-1/2 L pneumoperitoneum was then created, lap scopic trocar and sleeve were then introduced without difficulty.  3 fingerbreadths above the symphysis in the midline a 5 mm trocar was inserted under direct visualization.  The patient placed in Trendelenburg and the pelvic findings as follows:  The uterus itself was irregular at 1 horn due to the 4.3 cm known fibroid adnexa unremarkable appendix upper abdomen negative the cul-de-sac was free and clear specifically did not see any active areas of endometriosis.  Starting on the right atraumatic grasper was used to place the right tube on traction, the Enseal device was then used to coagulate and divide the mesosalpinx up to the uterus, that point the utero-ovarian pedicle was coagulated and divided all the way through to and including the round ligament.  The exact same repeated on the opposite side thus both ovaries were conserved.  After this was completed the vaginal portion of the procedure started  Legs were extended weighted speculum was positioned cervix grasped  with a tenaculum cervical vaginal mucosa was incised bladder was advanced superiorly with sharp and blunt dissection until the anterior peritoneal reflection could be identified, this was entered sharply then a retractor used to gently help the bladder out of the field.  Posterior culdotomy was performed at that point.  LigaSure device was then used to coagulate and divide first the uterosacral ligament, cardinal ligament, uterine vasculature pedicle and upper broad ligament pedicles.  These areas were hemostatic the fundus of the uterus was then delivered posteriorly remaining pedicles were clamped divided and free tied with 0 Vicryl.  Vaginal cuff was closed with a running locked 2-0 Monocryl suture from 3-9 o'clock Kolls culdoplasty suture used to plicate left uterosacral ligament to the opposite side picking up posterior peritoneum, to reinforce the posterior support.  Prior to closure sponge, needle, instrument counts reported as correct x2.  Peritoneum was not closed separately vaginal cuff closed right to left with interrupted 2-0 Vicryl interrupted sutures.  This was hemostatic bladder was drained with a Foley clear urine noted.  Repeat laparoscopy at that point, the noted a small amount of clot at the cuff, the bladder was backfilled with sterile milk noting to be intact this was released pelvis was irrigated aspirated inspected and noted to be hemostatic at the operative sites.  Instruments were removed, gas allowed to escape the upper incision closed with a 4-0 Monocryl subcuticular, Dermabond on the lower she tolerated this well went to recovery room in good condition.  Dictated with Dragon Medical 1  Margarette Asal MD

## 2018-05-05 NOTE — Transfer of Care (Signed)
Immediate Anesthesia Transfer of Care Note  Patient: Crystal Mcintosh  Procedure(s) Performed: LAPAROSCOPIC ASSISTED VAGINAL HYSTERECTOMY WITH SALPINGECTOMY (Bilateral Abdomen)  Patient Location: PACU  Anesthesia Type:General  Level of Consciousness: awake, alert  and oriented  Airway & Oxygen Therapy: Patient Spontanous Breathing and Patient connected to nasal cannula oxygen  Post-op Assessment: Report given to RN and Post -op Vital signs reviewed and stable  Post vital signs: Reviewed and stable  Last Vitals:  Vitals Value Taken Time  BP 139/92 05/05/2018  9:15 AM  Temp    Pulse 79 05/05/2018  9:18 AM  Resp 17 05/05/2018  9:18 AM  SpO2 100 % 05/05/2018  9:18 AM  Vitals shown include unvalidated device data.  Last Pain:  Vitals:   05/05/18 0631  TempSrc: Oral  PainSc: 2          Complications: No apparent anesthesia complications

## 2018-05-05 NOTE — Anesthesia Procedure Notes (Signed)
Procedure Name: Intubation Date/Time: 05/05/2018 7:39 AM Performed by: Bufford Spikes, CRNA Pre-anesthesia Checklist: Patient identified, Emergency Drugs available, Suction available and Patient being monitored Patient Re-evaluated:Patient Re-evaluated prior to induction Oxygen Delivery Method: Circle system utilized Preoxygenation: Pre-oxygenation with 100% oxygen Induction Type: IV induction Ventilation: Mask ventilation without difficulty Laryngoscope Size: Miller and 2 Grade View: Grade I Tube type: Oral Tube size: 7.0 mm Number of attempts: 1 Airway Equipment and Method: Stylet Placement Confirmation: ETT inserted through vocal cords under direct vision,  positive ETCO2 and breath sounds checked- equal and bilateral Secured at: 22 cm Tube secured with: Tape Dental Injury: Teeth and Oropharynx as per pre-operative assessment

## 2018-05-05 NOTE — Progress Notes (Signed)
The patient was re-examined with no change in status 

## 2018-05-06 ENCOUNTER — Encounter (HOSPITAL_COMMUNITY): Payer: Self-pay | Admitting: Obstetrics and Gynecology

## 2018-05-06 DIAGNOSIS — N870 Mild cervical dysplasia: Secondary | ICD-10-CM | POA: Diagnosis not present

## 2018-05-06 LAB — CBC
HCT: 43 % (ref 36.0–46.0)
Hemoglobin: 14.4 g/dL (ref 12.0–15.0)
MCH: 32.1 pg (ref 26.0–34.0)
MCHC: 33.5 g/dL (ref 30.0–36.0)
MCV: 96 fL (ref 80.0–100.0)
NRBC: 0 % (ref 0.0–0.2)
Platelets: 254 10*3/uL (ref 150–400)
RBC: 4.48 MIL/uL (ref 3.87–5.11)
RDW: 13.1 % (ref 11.5–15.5)
WBC: 10.4 10*3/uL (ref 4.0–10.5)

## 2018-05-06 MED ORDER — ONDANSETRON HCL 4 MG PO TABS: 4.0000 mg | ORAL_TABLET | Freq: Four times a day (QID) | ORAL | 0 refills | Status: DC | PRN

## 2018-05-06 MED ORDER — IBUPROFEN 200 MG PO TABS: 800.0000 mg | ORAL_TABLET | Freq: Three times a day (TID) | ORAL | 1 refills | Status: AC | PRN

## 2018-05-06 MED ORDER — OXYCODONE-ACETAMINOPHEN 5-325 MG PO TABS: 1.0000 | ORAL_TABLET | ORAL | 0 refills | Status: DC | PRN

## 2018-05-06 NOTE — Discharge Summary (Signed)
Physician Discharge Summary  Patient ID: Crystal Mcintosh MRN: [id number] DOB/AGE: [birth year] 51 y.o.  Admit date: 05/05/2018 Discharge date: 05/06/2018  Admission Diagnoses:Leiomyoma, menorrhagia  Discharge Diagnoses: same Active Problems:   Menorrhagia   Leiomyoma   Discharged Condition: good  Hospital Course: adm for LAVH + BS, on POD 1, afeb, tol PO and ready for D/C  Consults: None  Significant Diagnostic Studies: labs:  CBC    Component Value Date/Time   WBC 10.4 05/06/2018 0727   RBC 4.48 05/06/2018 0727   HGB 14.4 05/06/2018 0727   HCT 43.0 05/06/2018 0727   PLT 254 05/06/2018 0727   MCV 96.0 05/06/2018 0727   MCH 32.1 05/06/2018 0727   MCHC 33.5 05/06/2018 0727   RDW 13.1 05/06/2018 0727     Treatments: surgery: LAVH, BS  Discharge Exam: Blood pressure 131/75, pulse 82, temperature 98.8 F (37.1 C), temperature source Oral, resp. rate 14, height 5\' 4"  (1.626 m), weight 72.6 kg, SpO2 98 %. abd soft + BS, incs C/D  Disposition: Discharge disposition: 01-Home or Self Care        Allergies as of 05/06/2018      Reactions   Shellfish Allergy Hives   Zoloft [sertraline Hcl] Hives   Codeine Nausea Only      Medication List    STOP taking these medications   ALKA-SELTZER HEARTBURN + GAS 750-80 MG Chew Generic drug:  Calcium Carbonate-Simethicone   ENPRESSE-28 50-30/75-40/ 125-30 MCG Tabs Generic drug:  Levonorg-Eth Estrad Triphasic   fexofenadine-pseudoephedrine 180-240 MG 24 hr tablet Commonly known as:  ALLEGRA-D 24     TAKE these medications   citalopram 10 MG tablet Commonly known as:  CELEXA Take 10 mg by mouth daily.   fluticasone 50 MCG/ACT nasal spray Commonly known as:  FLONASE Place 1 spray into both nostrils 2 (two) times daily.   hydrochlorothiazide 12.5 MG tablet Commonly known as:  HYDRODIURIL Take 12.5 mg by mouth daily.   ibuprofen 200 MG tablet Commonly known as:  ADVIL Take 4 tablets (800 mg total) by mouth every  8 (eight) hours as needed for moderate pain.   levothyroxine 150 MCG tablet Commonly known as:  SYNTHROID, LEVOTHROID Take 150 mcg by mouth daily before breakfast.   ondansetron 4 MG tablet Commonly known as:  ZOFRAN Take 1 tablet (4 mg total) by mouth every 6 (six) hours as needed for nausea.   oxyCODONE-acetaminophen 5-325 MG tablet Commonly known as:  PERCOCET/ROXICET Take 1 tablet by mouth every 4 (four) hours as needed for severe pain.      Follow-up Information    Molli Posey, MD. Schedule an appointment as soon as possible for a visit in 1 week(s).   Specialty:  Obstetrics and Gynecology Contact information: [address] [place] [id number] [phone]           Signed: Margarette Asal 05/06/2018, 8:34 AM

## 2018-05-06 NOTE — Progress Notes (Signed)
Rn discussed discharge instructions with patient.  Reviewed postoperative care, medication changes, signs of infection at the incision site and follow up appointments.  Patient verbalized understanding.  IV removed at this time.  

## 2019-04-10 ENCOUNTER — Other Ambulatory Visit: Payer: Self-pay | Admitting: Obstetrics and Gynecology

## 2019-04-10 DIAGNOSIS — Z803 Family history of malignant neoplasm of breast: Secondary | ICD-10-CM

## 2019-05-16 ENCOUNTER — Ambulatory Visit
Admission: RE | Admit: 2019-05-16 | Discharge: 2019-05-16 | Disposition: A | Discharge status: Home / Self Care | Payer: BC Managed Care – PPO | Source: Ambulatory Visit | Attending: Obstetrics and Gynecology | Admitting: Obstetrics and Gynecology

## 2019-05-16 DIAGNOSIS — Z803 Family history of malignant neoplasm of breast: Secondary | ICD-10-CM

## 2019-05-16 MED ORDER — GADOBUTROL 1 MMOL/ML IV SOLN
7.0000 mL | Freq: Once | INTRAVENOUS | Status: AC | PRN
  Administered 2019-05-16: 7 mL via INTRAVENOUS

## 2019-07-15 ENCOUNTER — Ambulatory Visit: Payer: BC Managed Care – PPO | Attending: Internal Medicine

## 2019-07-15 ENCOUNTER — Other Ambulatory Visit: Payer: Self-pay

## 2019-07-15 DIAGNOSIS — Z23 Encounter for immunization: Secondary | ICD-10-CM | POA: Insufficient documentation

## 2019-07-15 NOTE — Progress Notes (Signed)
   Covid-19 Vaccination Clinic  Name:  Crystal Mcintosh    MRN: [id number] DOB: 09-Nov-1965  07/15/2019  Ms. Crystal Mcintosh was observed post Covid-19 immunization for 15 minutes without incidence. She was provided with Vaccine Information Sheet and instruction to access the V-Safe system.   Ms. Crystal Mcintosh was instructed to call 911 with any severe reactions post vaccine: Marland Kitchen Difficulty breathing  . Swelling of your face and throat  . A fast heartbeat  . A bad rash all over your body  . Dizziness and weakness    Immunizations Administered    Name Date Dose VIS Date Route   Moderna COVID-19 Vaccine 07/15/2019 10:26 AM 0.5 mL 04/18/2019 Intramuscular   Manufacturer: Moderna   Lot: [id number]   [id number]

## 2019-08-12 ENCOUNTER — Ambulatory Visit: Payer: BC Managed Care – PPO | Attending: Internal Medicine

## 2019-08-12 DIAGNOSIS — Z23 Encounter for immunization: Secondary | ICD-10-CM

## 2019-08-12 NOTE — Progress Notes (Signed)
   Covid-19 Vaccination Clinic  Name:  THELDA STOKKE    MRN: [id number] DOB: [birth year]  08/12/2019  Ms. Foiles was observed post Covid-19 immunization for 15 minutes without incident. She was provided with Vaccine Information Sheet and instruction to access the V-Safe system.   Ms. Kulkarni was instructed to call 911 with any severe reactions post vaccine: Marland Kitchen Difficulty breathing  . Swelling of face and throat  . A fast heartbeat  . A bad rash all over body  . Dizziness and weakness   Immunizations Administered    Name Date Dose VIS Date Route   Moderna COVID-19 Vaccine 08/12/2019  2:36 PM 0.5 mL 04/18/2019 Intramuscular   Manufacturer: Levan Hurst   [id number]   [place]:[id number]

## 2019-08-15 ENCOUNTER — Ambulatory Visit: Payer: BC Managed Care – PPO

## 2019-11-16 ENCOUNTER — Other Ambulatory Visit: Payer: Self-pay | Admitting: Sports Medicine

## 2021-05-07 IMAGING — MR MR BREAST BILAT WO/W CM
8 of 12 series · 33 of 48 positions shown · IV contrast (7 ml gadavist)
Comparison: Previous exam(s).

CLINICAL DATA: 53-year-old female with family history of maternal
grandmother at age 60. No current breast related issues.

LABS:  Creatinine was obtained on site at [HOSPITAL] at [REDACTED] [HOSPITAL].
Results: Creatinine 0.9 mg/dL.
EXAM:
BILATERAL BREAST MRI WITH AND WITHOUT CONTRAST
TECHNIQUE: Multiplanar, multisequence MR images of both breasts were obtained
prior to and following the intravenous administration of 7 ml of
Gadavist.

[Series 2: t2_tirm_tra ipat (a-p) · axial · 3.0mm · 0.70mm/px · 1 of 60 slices shown]
[im 1/60]
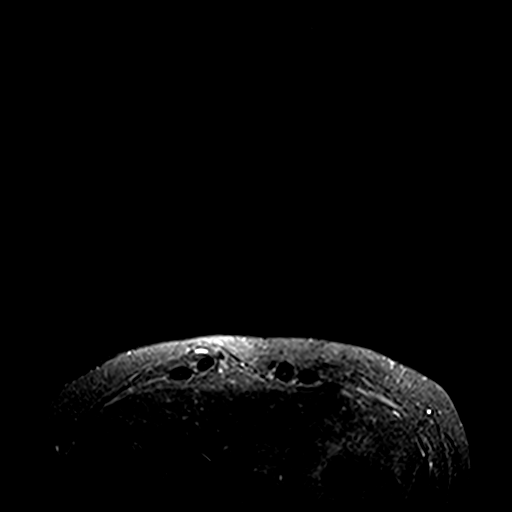

[Series 3: fl3d pre-cm no · axial · non-contrast · 1.2mm · 0.94mm/px · z∈[-98,+93]mm · 5 of 160 slices shown]
[im 1/160]
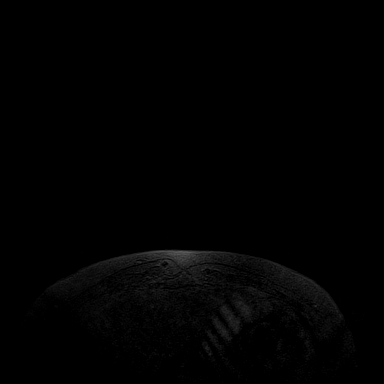
[im 40/160]
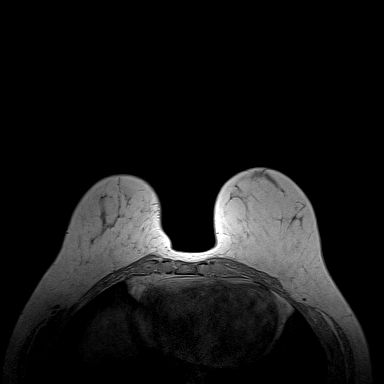
[im 80/160]
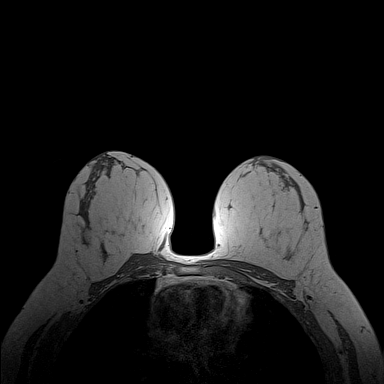
[im 120/160]
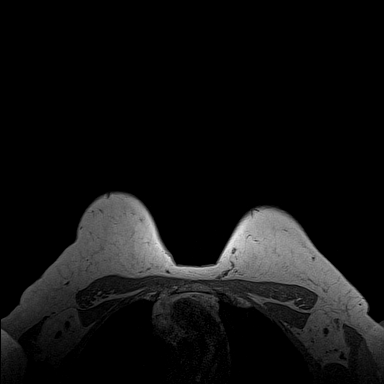
[im 160/160]
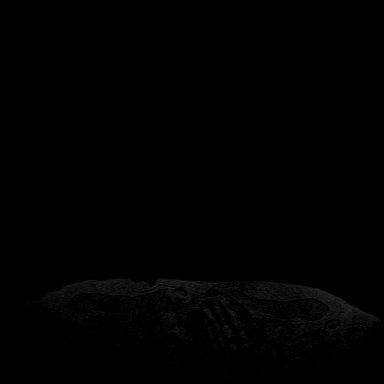

[Series 4: fl3d pre-cm · axial · non-contrast · 1.2mm · 0.94mm/px · z∈[-98,+93]mm · 5 of 160 slices shown]
[im 1/160]
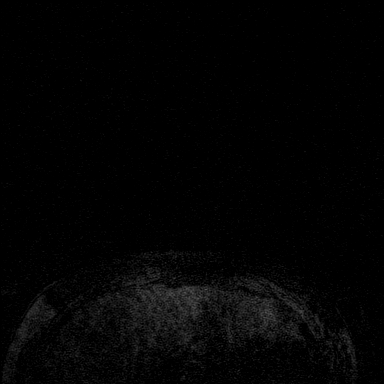
[im 40/160]
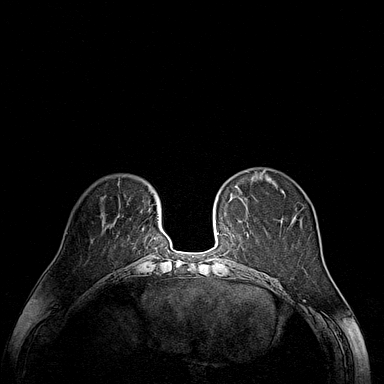
[im 80/160]
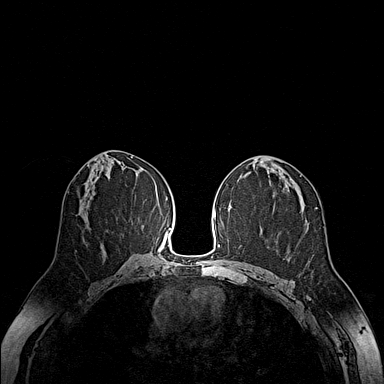
[im 120/160]
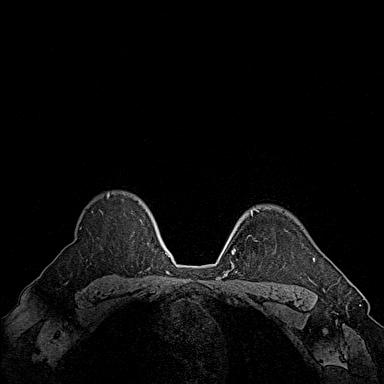
[im 160/160]
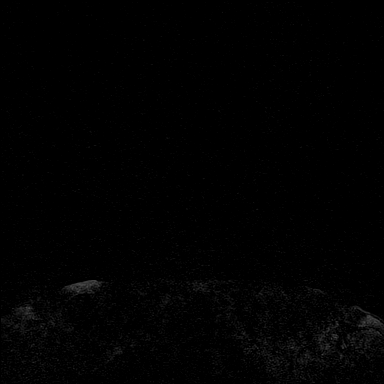

[Series 5: fl3d post-cm 20 · axial · 1.2mm · 0.94mm/px · z∈[-98,+93]mm · 5 of 160 slices shown (1 of 3)]
[im 1/160]
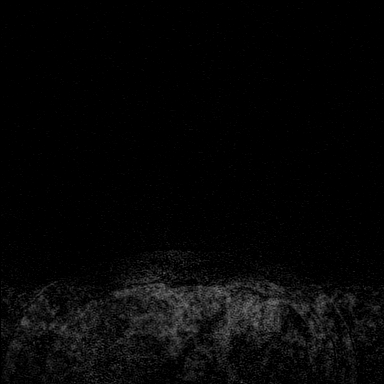
[im 40/160]
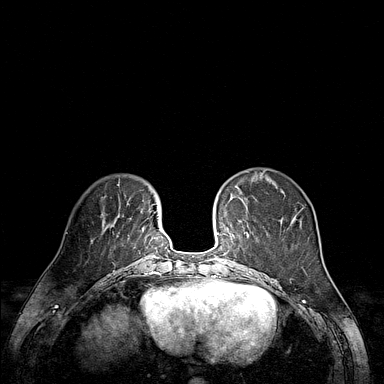
[im 80/160]
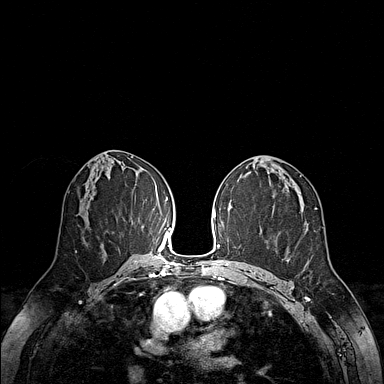
[im 120/160]
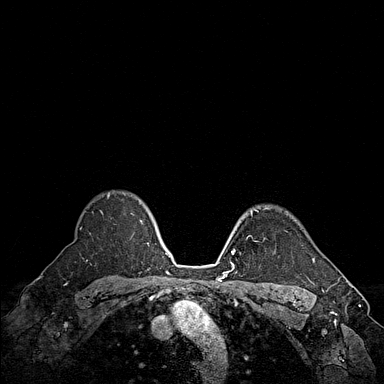
[im 160/160]
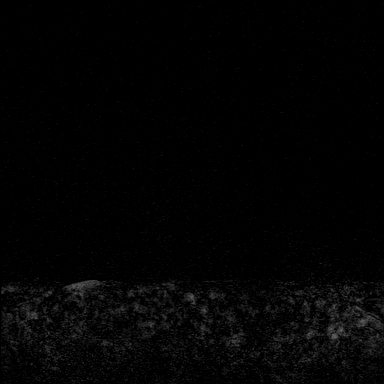

[Series 6: fl3d post-cm 20 · axial · 1.2mm · 0.94mm/px · z∈[-98,+93]mm · 5 of 160 slices shown (2 of 3)]
[im 1/160]
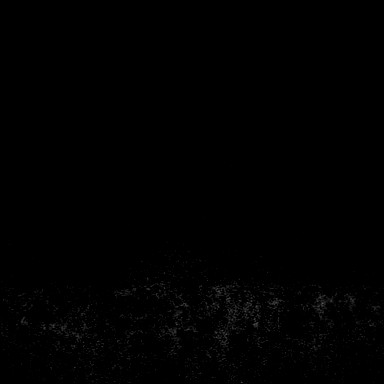
[im 40/160]
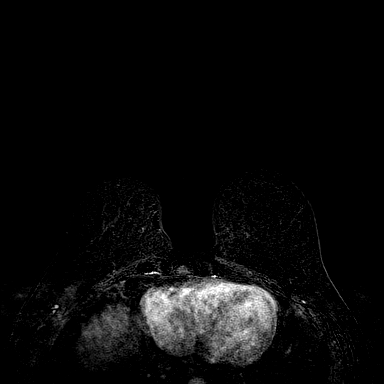
[im 80/160]
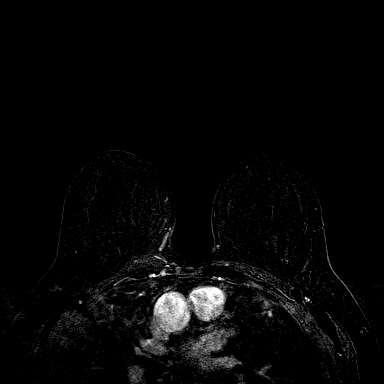
[im 120/160]
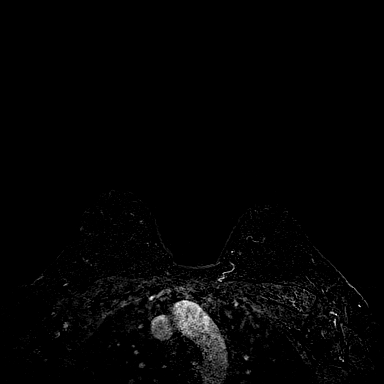
[im 160/160]
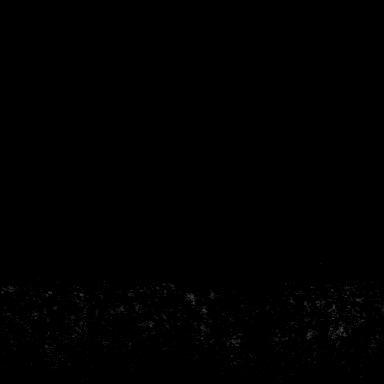

[Series 7: fl3d post-cm 20 · axial · 192.0mm · 0.94mm/px · 1 of 1 slices shown (3 of 3)]
[im 1/1]
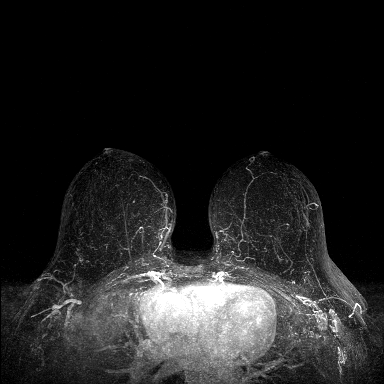

[Series 8: fl3d post-cm 3min · axial · 1.2mm · 0.94mm/px · z∈[-98,+93]mm · 6 of 160 slices shown]
[im 1/160]
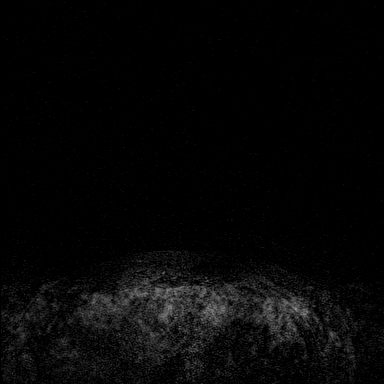
[im 32/160]
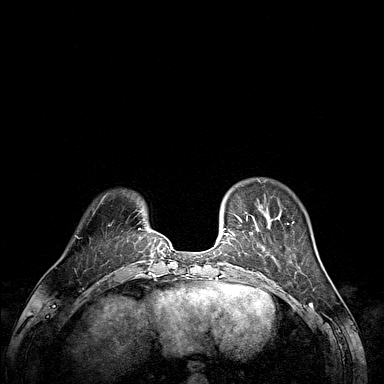
[im 64/160]
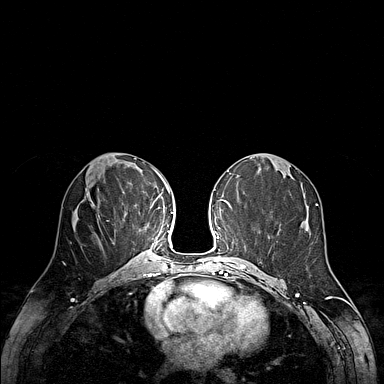
[im 96/160]
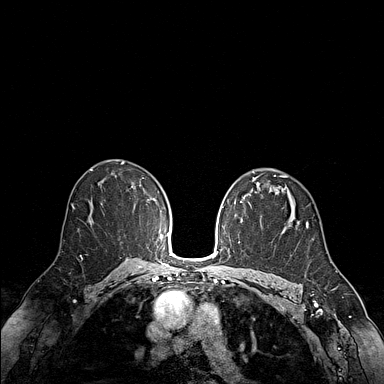
[im 128/160]
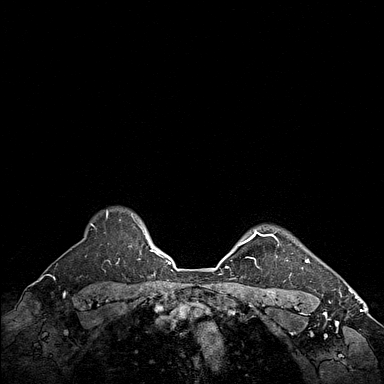
[im 160/160]
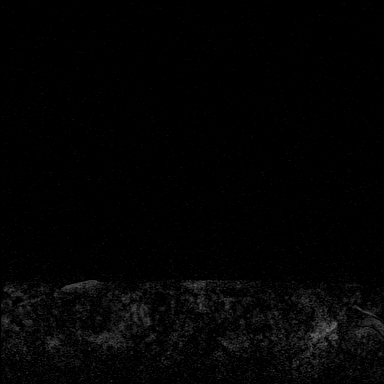

[Series 9: fl3d post-cm 3min_sub · axial · 1.2mm · 0.94mm/px · z∈[-98,+54]mm · 5 of 160 slices shown]
[im 1/160]
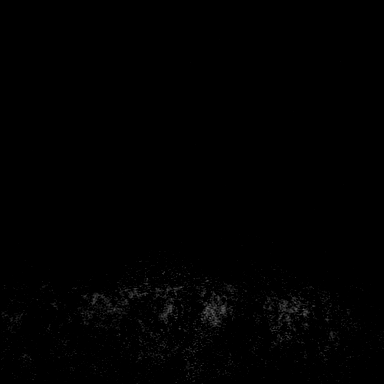
[im 32/160]
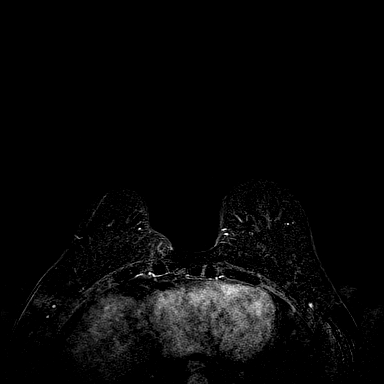
[im 64/160]
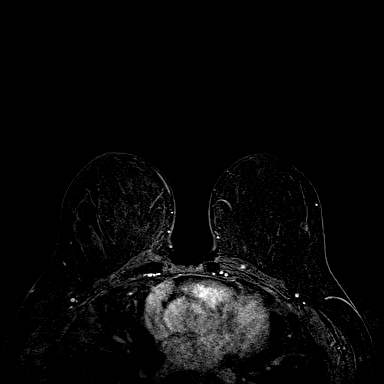
[im 96/160]
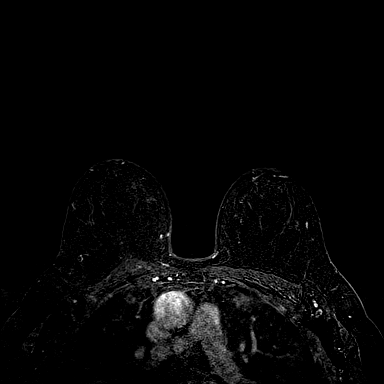
[im 128/160]
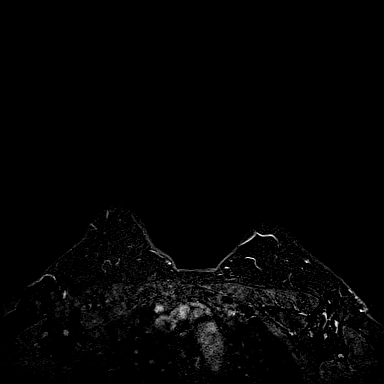

[33 of 48 positions shown; findings below may reference images not displayed]

Three-dimensional MR images were rendered by post-processing of the
original MR data on an independent workstation. The
three-dimensional MR images were interpreted, and findings are
reported in the following complete MRI report for this study. Three
dimensional images were evaluated at the independent [employer]
workstation
FINDINGS: Breast composition: b. Scattered fibroglandular tissue.

Background parenchymal enhancement: Mild.

Right breast: No mass or abnormal enhancement.

Left breast: No mass or abnormal enhancement.

Lymph nodes: No abnormal appearing lymph nodes.

Ancillary findings:  None.
IMPRESSION: No MRI evidence of malignancy in either breast.

RECOMMENDATION:
Routine annual screening. The patient is due for her next screening
mammogram in February 2020.

BI-RADS CATEGORY  1: Negative.

## 2021-12-18 ENCOUNTER — Ambulatory Visit: Payer: BC Managed Care – PPO | Admitting: Interventional Cardiology

## 2021-12-18 ENCOUNTER — Encounter: Payer: Self-pay | Admitting: Interventional Cardiology

## 2021-12-18 VITALS — BP 112/90 | HR 71 | Ht 63.0 in | Wt 161.0 lb

## 2021-12-18 DIAGNOSIS — I10 Essential (primary) hypertension: Secondary | ICD-10-CM

## 2021-12-18 DIAGNOSIS — R0609 Other forms of dyspnea: Secondary | ICD-10-CM

## 2021-12-18 DIAGNOSIS — R072 Precordial pain: Secondary | ICD-10-CM

## 2021-12-18 MED ORDER — METOPROLOL TARTRATE 100 MG PO TABS: ORAL_TABLET | ORAL | 0 refills | Status: DC

## 2021-12-18 NOTE — Progress Notes (Signed)
Cardiology Office Note   Date:  12/18/2021   ID:  Crystal Mcintosh, DOB February 04, 1966, MRN [id number]  PCP:  Seward Carol, MD    No chief complaint on file.  Abnormal ECG  Wt Readings from Last 3 Encounters:  12/18/21 161 lb (73 kg)  05/05/18 160 lb (72.6 kg)  04/29/18 160 lb (72.6 kg)       History of Present Illness: Crystal Mcintosh is a 56 y.o. female who is being seen today for the evaluation of dyspnea at the request of Seward Carol, MD.   She had a work-up at her primary care doctor's office including D-dimer troponin and ECG.  The Troponin was negative.  The ECG was reported as being abnormal.    She has had DOE for several months, worse with activity.  Uses a stepper at home.  She walks as well.  Over the past few weeks, she has had chest discomfort with exercise 3-4x/week.  It does not get worse if she continues to exercise.  Associated with lightheadedness. HCTZ was stopped.   Denies : Leg edema. Nitroglycerin use. Orthopnea. Palpitations. Paroxysmal nocturnal dyspnea. Shortness of breath. Syncope.    Past Medical History:  Diagnosis Date   Allergic rhinitis    Anxiety    Arthritis    knees/hips - no meds   Depression    Elevated blood pressure reading    GERD (gastroesophageal reflux disease)    Graves disease    Herpes zoster    History of IBS    Hypertension    Hypothyroidism    IBS (irritable bowel syndrome)    Menorrhagia with regular cycle    Panic attack    PONV (postoperative nausea and vomiting)    Rosacea    Seasonal allergies    Sleep disorder    Uterine fibroid    Wears contact lenses     Past Surgical History:  Procedure Laterality Date   COLONOSCOPY  2018   DIAGNOSTIC LAPAROSCOPY     endometriosis   KNEE SURGERY     LAPAROSCOPIC VAGINAL HYSTERECTOMY WITH SALPINGECTOMY Bilateral 05/05/2018   Procedure: LAPAROSCOPIC ASSISTED VAGINAL HYSTERECTOMY WITH SALPINGECTOMY;  Surgeon: Molli Posey, MD;  Location: [facility];  Service:  Gynecology;  Laterality: Bilateral;   TONSILLECTOMY       Current Outpatient Medications  Medication Sig Dispense Refill   Apremilast (OTEZLA) 30 MG TABS Take 30 mg by mouth.     Aspirin-Acetaminophen-Caffeine (EXCEDRIN EXTRA STRENGTH PO) as needed.     betamethasone dipropionate 0.05 % lotion Apply topically 2 (two) times daily.     betamethasone, augmented, (DIPROLENE) 0.05 % lotion APPLY TO SCALP / EARS ONCE TO TWICE DAILY AS NEEDED     calcium carbonate (TUMS - DOSED IN MG ELEMENTAL CALCIUM) 500 MG chewable tablet Chew 1 tablet by mouth daily.     citalopram (CELEXA) 10 MG tablet Take 10 mg by mouth daily. Per patient taking 20 mg daily     estradiol (CLIMARA - DOSED IN MG/24 HR) 0.05 mg/24hr patch Place 0.05 mg onto the skin once a week.     Estradiol (DIVIGEL) 1.25 MG/1.25GM GEL Place onto the skin.     fexofenadine (ALLEGRA) 180 MG tablet Take 180 mg by mouth daily.     fluticasone (FLONASE) 50 MCG/ACT nasal spray Place 1 spray into both nostrils 2 (two) times daily.     hydrochlorothiazide (HYDRODIURIL) 12.5 MG tablet Take 12.5 mg by mouth daily.     ibuprofen (ADVIL) 200  MG tablet Take 4 tablets (800 mg total) by mouth every 8 (eight) hours as needed for moderate pain. 30 tablet 1   levothyroxine (SYNTHROID) 100 MCG tablet Take 100 mcg by mouth every morning.     meloxicam (MOBIC) 15 MG tablet Take 15 mg by mouth as needed.     tretinoin (RETIN-A) 0.025 % cream Apply topically at bedtime.     levothyroxine (SYNTHROID, LEVOTHROID) 150 MCG tablet Take 150 mcg by mouth daily before breakfast.  (Patient not taking: Reported on 12/18/2021)  9   ondansetron (ZOFRAN) 4 MG tablet Take 1 tablet (4 mg total) by mouth every 6 (six) hours as needed for nausea. (Patient not taking: Reported on 12/18/2021) 20 tablet 0   oxyCODONE-acetaminophen (PERCOCET/ROXICET) 5-325 MG tablet Take 1 tablet by mouth every 4 (four) hours as needed for severe pain. (Patient not taking: Reported on 12/18/2021) 30 tablet 0    valACYclovir (VALTREX) 1000 MG tablet Take 1,000 mg by mouth 2 (two) times daily. (Patient not taking: Reported on 12/18/2021)     No current facility-administered medications for this visit.    Allergies:   Shellfish allergy, Zoloft [sertraline hcl], and Codeine    Social History:  The patient  reports that she has never smoked. She has never used smokeless tobacco. She reports current alcohol use. She reports that she does not use drugs.   Family History:  The patient's family history includes Heart attack in her father and maternal grandfather; Mitral valve prolapse in her mother.    ROS:  Please see the history of present illness.   Otherwise, review of systems are positive for CP, DOE.   All other systems are reviewed and negative.    PHYSICAL EXAM: VS:  BP (!) 112/90   Pulse 71   Ht '5\' 3"'$  (1.6 m)   Wt 161 lb (73 kg)   SpO2 99%   BMI 28.52 kg/m  , BMI Body mass index is 28.52 kg/m. GEN: Well nourished, well developed, in no acute distress HEENT: normal Neck: no JVD, carotid bruits, or masses Cardiac: RRR; no murmurs, rubs, or gallops,no edema  Respiratory:  clear to auscultation bilaterally, normal work of breathing GI: soft, nontender, nondistended, + BS MS: no deformity or atrophy Skin: warm and dry, no rash Neuro:  Strength and sensation are intact Psych: euthymic mood, full affect   EKG:   The ekg ordered today demonstrates NSR, nonspecific ST changes   Recent Labs: No results found for requested labs within last 365 days.   Lipid Panel No results found for: "CHOL", "TRIG", "HDL", "CHOLHDL", "VLDL", "LDLCALC", "LDLDIRECT"   Other studies Reviewed: Additional studies/ records that were reviewed today with results demonstrating: November 2022, total cholesterol 186, HDL 47, LDL 107, triglycerides 187.   ASSESSMENT AND PLAN:  Precordial chest pain: Significant family history of premature coronary artery disease.  We will plan for CTA coronaries.  This  information including calcium score will help with risk factor modification.  Metoprolol 100 mg prior to the test. DOE: No obvious signs of heart failure on exam.  Will check echocardiogram.  Appears euvolemic.  Could be a sign of ischemia.  Await CTA results. HTN: now off of HCTZ.     Current medicines are reviewed at length with the patient today.  The patient concerns regarding her medicines were addressed.  The following changes have been made:  No change  Labs/ tests ordered today include: As above No orders of the defined types were placed in this  encounter.   Recommend 150 minutes/week of aerobic exercise Low fat, low carb, high fiber diet recommended  Disposition:   FU in based on test results   Signed, Larae Grooms, MD  12/18/2021 2:31 PM    [facility] [place], [place],   [address] Phone: [phone]; Fax: [phone]

## 2021-12-18 NOTE — Patient Instructions (Addendum)
Medication Instructions:  Your physician recommends that you continue on your current medications as directed. Please refer to the Current Medication list given to you today.  *If you need a refill on your cardiac medications before your next appointment, please call your pharmacy*   Lab Work: Lab work to be done today--BMP If you have labs (blood work) drawn today and your tests are completely normal, you will receive your results only by: [place] (if you have MyChart) OR A paper copy in the mail If you have any lab test that is abnormal or we need to change your treatment, we will call you to review the results.   Testing/Procedures: Your physician has requested that you have an echocardiogram. Echocardiography is a painless test that uses sound waves to create images of your heart. It provides your doctor with information about the size and shape of your heart and how well your heart's chambers and valves are working. This procedure takes approximately one hour. There are no restrictions for this procedure.  Your physician has requested that you have cardiac CT. Cardiac computed tomography (CT) is a painless test that uses an x-ray machine to take clear, detailed pictures of your heart. For further information please visit [email or web address]. Please follow instruction sheet as given.     Follow-Up: At [facility], you and your health needs are our priority.  As part of our continuing mission to provide you with exceptional heart care, we have created designated Provider Care Teams.  These Care Teams include your primary Cardiologist (physician) and Advanced Practice Providers (APPs -  Physician Assistants and Nurse Practitioners) who all work together to provide you with the care you need, when you need it.  We recommend signing up for the patient portal called "MyChart".  Sign up information is provided on this After Visit Summary.  MyChart is used to connect with patients  for Virtual Visits (Telemedicine).  Patients are able to view lab/test results, encounter notes, upcoming appointments, etc.  Non-urgent messages can be sent to your provider as well.   To learn more about what you can do with MyChart, go to [email or web address].    Your next appointment:   Based on results  The format for your next appointment:   In Person  Provider:   Larae Grooms, MD     Other Instructions    Your cardiac CT will be scheduled at one of the below locations:   [facility] [address] [place], [place] [address] [phone]  [facility] [address] [place],  [address] [phone]  If scheduled at [facility], please arrive at the [facility] Entrance (Entrance C2) of [facility] 30 minutes prior to test start time. You can use the FREE valet parking offered at entrance C (encouraged to control the heart rate for the test)  Proceed to the [facility] Radiology Department (first floor) to check-in and test prep.  All radiology patients and guests should use entrance C2 at [facility], accessed from [facility], even though the hospital's physical address listed is [address]    If scheduled at [facility], please arrive 15 mins early for check-in and test prep.  Please follow these instructions carefully (unless otherwise directed):   On the Night Before the Test: Be sure to Drink plenty of water. Do not consume any caffeinated/decaffeinated beverages or chocolate 12 hours prior to your  test. Do not take any antihistamines 12 hours prior to your test.  On the Day of the Test: Drink plenty of water until 1 hour prior to the test. Do not eat any food 4 hours prior to the test. You may take your regular medications prior to the test.  Take metoprolol (Lopressor) two hours prior to  test. HOLD Furosemide/Hydrochlorothiazide morning of the test. FEMALES- please wear underwire-free bra if available, avoid dresses & tight clothing          After the Test: Drink plenty of water. After receiving IV contrast, you may experience a mild flushed feeling. This is normal. On occasion, you may experience a mild rash up to 24 hours after the test. This is not dangerous. If this occurs, you can take Benadryl 25 mg and increase your fluid intake. If you experience trouble breathing, this can be serious. If it is severe call 911 IMMEDIATELY. If it is mild, please call our office. If you take any of these medications: Glipizide/Metformin, Avandament, Glucavance, please do not take 48 hours after completing test unless otherwise instructed.  We will call to schedule your test 2-4 weeks out understanding that some insurance companies will need an authorization prior to the service being performed.   For non-scheduling related questions, please contact the cardiac imaging nurse navigator should you have any questions/concerns: Marchia Bond, Cardiac Imaging Nurse Navigator Gordy Clement, Cardiac Imaging Nurse Navigator [facility] Direct Office Dial: [phone]   For scheduling needs, including cancellations and rescheduling, please call [place], [phone].   Important Information About Sugar

## 2021-12-19 LAB — BASIC METABOLIC PANEL
BUN/Creatinine Ratio: 13 (ref 9–23)
BUN: 11 mg/dL (ref 6–24)
CO2: 25 mmol/L (ref 20–29)
Calcium: 9.6 mg/dL (ref 8.7–10.2)
Chloride: 105 mmol/L (ref 96–106)
Creatinine, Ser: 0.82 mg/dL (ref 0.57–1.00)
Glucose: 92 mg/dL (ref 70–99)
Potassium: 4.2 mmol/L (ref 3.5–5.2)
Sodium: 143 mmol/L (ref 134–144)
eGFR: 84 mL/min/{1.73_m2} (ref >59)

## 2021-12-25 ENCOUNTER — Ambulatory Visit (HOSPITAL_COMMUNITY): Payer: BC Managed Care – PPO | Attending: Interventional Cardiology

## 2021-12-25 DIAGNOSIS — R072 Precordial pain: Secondary | ICD-10-CM | POA: Diagnosis not present

## 2021-12-25 DIAGNOSIS — R0609 Other forms of dyspnea: Secondary | ICD-10-CM | POA: Diagnosis not present

## 2021-12-25 LAB — ECHOCARDIOGRAM COMPLETE
Area-P 1/2: 3.6 cm2
S' Lateral: 3.5 cm

## 2022-01-06 ENCOUNTER — Telehealth (HOSPITAL_COMMUNITY): Payer: Self-pay | Admitting: *Deleted

## 2022-01-06 NOTE — Telephone Encounter (Signed)
Reaching out to patient to offer assistance regarding upcoming cardiac imaging study; pt verbalizes understanding of appt date/time, parking situation and where to check in, pre-test NPO status and medications ordered, and verified current allergies; name and call back number provided for further questions should they arise ? ?Leny Morozov RN Navigator Cardiac Imaging ?[facility] ?[phone] office ?[phone] cell ? ?Patient to take 100mg metoprolol tartrate two hours prior to her cardiac CT scan.  She is aware to arrive at 1:30pm. ? ?

## 2022-01-06 NOTE — Telephone Encounter (Signed)
Attempted to call patient regarding upcoming cardiac CT appointment. °Left message on voicemail with name and callback number ° °Makaio Mach RN Navigator Cardiac Imaging °[facility] °[phone] Office °[phone] Cell ° °

## 2022-01-07 ENCOUNTER — Ambulatory Visit (HOSPITAL_COMMUNITY)
Admission: RE | Admit: 2022-01-07 | Discharge: 2022-01-07 | Disposition: A | Discharge status: Home / Self Care | Payer: BC Managed Care – PPO | Source: Ambulatory Visit | Attending: Interventional Cardiology | Admitting: Interventional Cardiology

## 2022-01-07 DIAGNOSIS — R072 Precordial pain: Secondary | ICD-10-CM

## 2022-01-07 MED ORDER — NITROGLYCERIN 0.4 MG SL SUBL
0.8000 mg | SUBLINGUAL_TABLET | Freq: Once | SUBLINGUAL | Status: AC
  Administered 2022-01-07: 0.8 mg via SUBLINGUAL

## 2022-01-07 MED ORDER — IOHEXOL 350 MG/ML SOLN
95.0000 mL | Freq: Once | INTRAVENOUS | Status: AC | PRN
  Administered 2022-01-07: 95 mL via INTRAVENOUS

## 2022-01-07 MED ORDER — NITROGLYCERIN 0.4 MG SL SUBL
SUBLINGUAL_TABLET | SUBLINGUAL | Status: AC
  Filled 2022-01-07: qty 2

## 2022-01-07 NOTE — Progress Notes (Signed)
After IV removed, noted an area of swelling and patient did say it did hurt in the area during injection. Did get a good study as per [facility] CT tech. As per CT tech extravasation occurred with NS and instructed patient to use warmth to area on and off for 24 hours and elevation. No need to see PA. Discharged walking. Feels fine, aware to drink plenty of water for rest of today.

## 2022-01-13 ENCOUNTER — Telehealth: Payer: Self-pay | Admitting: *Deleted

## 2022-01-13 DIAGNOSIS — Z79899 Other long term (current) drug therapy: Secondary | ICD-10-CM

## 2022-01-13 DIAGNOSIS — R931 Abnormal findings on diagnostic imaging of heart and coronary circulation: Secondary | ICD-10-CM

## 2022-01-13 MED ORDER — ROSUVASTATIN CALCIUM 10 MG PO TABS: 10.0000 mg | ORAL_TABLET | Freq: Every day | ORAL | 3 refills | Status: DC

## 2022-01-13 NOTE — Telephone Encounter (Signed)
-----   Message from Jettie Booze, MD sent at 01/08/2022  5:11 PM EDT ----- Mild, nonobstructive coronary artery disease.  Given calcium score in the 86 percentile, statin is indicated.  Would start rosuvastatin 10 mg daily.  Would try to get LDL closer to 70.  Healthy diet and regular exercise as well.  Follow-up in 1 year.

## 2022-01-13 NOTE — Telephone Encounter (Signed)
I spoke with patient and reviewed results with her.  Prescription sent to [facility] in [place].  She will come in for fasting lipid and liver profiles on 04/07/22

## 2022-04-08 ENCOUNTER — Ambulatory Visit: Payer: BC Managed Care – PPO | Attending: Interventional Cardiology

## 2022-04-08 DIAGNOSIS — Z79899 Other long term (current) drug therapy: Secondary | ICD-10-CM

## 2022-04-08 DIAGNOSIS — R931 Abnormal findings on diagnostic imaging of heart and coronary circulation: Secondary | ICD-10-CM

## 2022-04-09 LAB — HEPATIC FUNCTION PANEL
ALT: 14 IU/L (ref 0–32)
AST: 19 IU/L (ref 0–40)
Albumin: 4.1 g/dL (ref 3.8–4.9)
Alkaline Phosphatase: 59 IU/L (ref 44–121)
Bilirubin Total: 0.4 mg/dL (ref 0.0–1.2)
Bilirubin, Direct: 0.15 mg/dL (ref 0.00–0.40)
Total Protein: 5.7 g/dL — ABNORMAL LOW (ref 6.0–8.5)

## 2022-04-09 LAB — LIPID PANEL
Chol/HDL Ratio: 2.5 ratio (ref 0.0–4.4)
Cholesterol, Total: 114 mg/dL (ref 100–199)
HDL: 46 mg/dL (ref >39)
LDL Chol Calc (NIH): 57 mg/dL (ref 0–99)
Triglycerides: 45 mg/dL (ref 0–149)
VLDL Cholesterol Cal: 11 mg/dL (ref 5–40)

## 2022-12-15 ENCOUNTER — Other Ambulatory Visit: Payer: Self-pay | Admitting: Interventional Cardiology

## 2023-01-15 ENCOUNTER — Other Ambulatory Visit: Payer: Self-pay | Admitting: Interventional Cardiology

## 2023-01-27 ENCOUNTER — Other Ambulatory Visit: Payer: Self-pay | Admitting: Interventional Cardiology

## 2023-03-03 ENCOUNTER — Other Ambulatory Visit: Payer: Self-pay | Admitting: Interventional Cardiology

## 2023-03-15 ENCOUNTER — Other Ambulatory Visit: Payer: Self-pay | Admitting: Interventional Cardiology

## 2023-09-20 ENCOUNTER — Other Ambulatory Visit: Payer: Self-pay | Admitting: Interventional Cardiology

## 2023-11-08 ENCOUNTER — Encounter: Payer: Self-pay | Admitting: Cardiology

## 2023-11-08 ENCOUNTER — Ambulatory Visit

## 2023-11-08 ENCOUNTER — Ambulatory Visit: Payer: Self-pay | Attending: Cardiology | Admitting: Cardiology

## 2023-11-08 VITALS — BP 124/80 | HR 65 | Resp 16 | Ht 63.0 in | Wt 161.8 lb

## 2023-11-08 DIAGNOSIS — I2510 Atherosclerotic heart disease of native coronary artery without angina pectoris: Secondary | ICD-10-CM

## 2023-11-08 DIAGNOSIS — E785 Hyperlipidemia, unspecified: Secondary | ICD-10-CM

## 2023-11-08 DIAGNOSIS — I10 Essential (primary) hypertension: Secondary | ICD-10-CM

## 2023-11-08 DIAGNOSIS — R002 Palpitations: Secondary | ICD-10-CM

## 2023-11-08 MED ORDER — ROSUVASTATIN CALCIUM 10 MG PO TABS: 10.0000 mg | ORAL_TABLET | Freq: Every day | ORAL | 3 refills | Status: DC

## 2023-11-08 NOTE — Progress Notes (Signed)
 Cardiology Office Note:    Date:  11/08/2023   ID:  ALYSSAH ALGEO, DOB [birth year], MRN [id number]  PCP:  Rexanne Ingle, MD  Cardiologist:  Candyce Reek, MD  Electrophysiologist:  None   Referring MD: Rexanne Ingle, MD   Chief Complaint  Patient presents with   Palpitations    History of Present Illness:    Crystal Mcintosh is a 58 y.o. female with a hx of thyroid disease, hypertension who presents for follow-up.  She previously followed with Dr. Reek, last seen 12/2021.  She had reported chest pain and shortness of breath.  Echocardiogram 12/2021 showed normal biventricular function, G1DD, no significant valvular disease.  Coronary CTA 12/2021 showed mild nonobstructive CAD, calcium  score 21 (86 percentile).  Father died at 21 of MI.  Brother had PCI in late 52s.    Since last clinic visit, she reports she is doing well.  She denies any recent chest pain.  Does have some dyspnea, especially in the morning.  She walks 1 mile per day and does stepping exercises at home 30 to 40 minutes, does not report much dyspnea when exercising, dyspnea tends to occur more at rest.  Denies any exertional chest pain.  She denies any lightheadedness, syncope, lower extreme edema.  Does report she is not having palpitations about 1-2 times per week, feels like heart is fluttering, typically last less than 1 minute.   Past Medical History:  Diagnosis Date   Allergic rhinitis    Anxiety    Arthritis    knees/hips - no meds   Depression    Elevated blood pressure reading    GERD (gastroesophageal reflux disease)    Graves disease    Herpes zoster    History of IBS    Hypertension    Hypothyroidism    IBS (irritable bowel syndrome)    Menorrhagia with regular cycle    Panic attack    PONV (postoperative nausea and vomiting)    Rosacea    Seasonal allergies    Sleep disorder    Uterine fibroid    Wears contact lenses     Past Surgical History:  Procedure Laterality Date    COLONOSCOPY  2018   DIAGNOSTIC LAPAROSCOPY     endometriosis   KNEE SURGERY     LAPAROSCOPIC VAGINAL HYSTERECTOMY WITH SALPINGECTOMY Bilateral 05/05/2018   Procedure: LAPAROSCOPIC ASSISTED VAGINAL HYSTERECTOMY WITH SALPINGECTOMY;  Surgeon: Johnnye Ade, MD;  Location: [facility];  Service: Gynecology;  Laterality: Bilateral;   TONSILLECTOMY      Current Medications: Current Meds  Medication Sig   Aspirin-Acetaminophen -Caffeine (EXCEDRIN EXTRA STRENGTH PO) as needed.   betamethasone dipropionate 0.05 % lotion Apply topically 2 (two) times daily.   betamethasone, augmented, (DIPROLENE) 0.05 % lotion APPLY TO SCALP / EARS ONCE TO TWICE DAILY AS NEEDED   calcium  carbonate (TUMS - DOSED IN MG ELEMENTAL CALCIUM ) 500 MG chewable tablet Chew 1 tablet by mouth daily.   citalopram  (CELEXA ) 10 MG tablet Take 10 mg by mouth daily. Per patient taking 20 mg daily   estradiol (CLIMARA - DOSED IN MG/24 HR) 0.05 mg/24hr patch Place 0.05 mg onto the skin once a week.   Estradiol (DIVIGEL) 1.25 MG/1.25GM GEL Place onto the skin.   fexofenadine (ALLEGRA) 180 MG tablet Take 180 mg by mouth daily.   fluticasone  (FLONASE ) 50 MCG/ACT nasal spray Place 1 spray into both nostrils 2 (two) times daily.   ibuprofen  (ADVIL ) 200 MG tablet Take 4 tablets (800 mg total) by mouth  every 8 (eight) hours as needed for moderate pain.   levothyroxine  (SYNTHROID ) 100 MCG tablet Take 100 mcg by mouth every morning.   tretinoin (RETIN-A) 0.025 % cream Apply topically at bedtime.   [DISCONTINUED] rosuvastatin  (CRESTOR ) 10 MG tablet Take 1 tablet (10 mg total) by mouth daily.     Allergies:   Shellfish allergy, Zoloft [sertraline hcl], and Codeine   Social History   Socioeconomic History   Marital status: Married    Spouse name: Not on file   Number of children: Not on file   Years of education: Not on file   Highest education level: Not on file  Occupational History   Not on file  Tobacco Use   Smoking status: Never    Smokeless tobacco: Never  Vaping Use   Vaping status: Never Used  Substance and Sexual Activity   Alcohol use: Yes    Comment: rare   Drug use: Never   Sexual activity: Yes    Birth control/protection: Pill  Other Topics Concern   Not on file  Social History Narrative   Not on file   Social Drivers of Health   Financial Resource Strain: Not on file  Food Insecurity: Not on file  Transportation Needs: Not on file  Physical Activity: Not on file  Stress: Not on file  Social Connections: Not on file     Family History: The patient's family history includes Heart attack in her father and maternal grandfather; Mitral valve prolapse in her mother.  ROS:   Please see the history of present illness.     All other systems reviewed and are negative.  EKGs/Labs/Other Studies Reviewed:    The following studies were reviewed today:   EKG:   11/08/2023: Normal sinus rhythm, rate 65, left axis deviation, nonspecific T wave flattening  Recent Labs: No results found for requested labs within last 365 days.  Recent Lipid Panel    Component Value Date/Time   CHOL 114 04/08/2022 0729   TRIG 45 04/08/2022 0729   HDL 46 04/08/2022 0729   CHOLHDL 2.5 04/08/2022 0729   LDLCALC 57 04/08/2022 0729    Physical Exam:    VS:  BP 124/80 (BP Location: Left Arm, Patient Position: Sitting, Cuff Size: Normal)   Pulse 65   Resp 16   Ht 5' 3 (1.6 m)   Wt 161 lb 12.8 oz (73.4 kg)   SpO2 97%   BMI 28.66 kg/m     Wt Readings from Last 3 Encounters:  11/08/23 161 lb 12.8 oz (73.4 kg)  12/18/21 161 lb (73 kg)  05/05/18 160 lb (72.6 kg)     GEN:  Well nourished, well developed in no acute distress HEENT: Normal NECK: No JVD; No carotid bruits LYMPHATICS: No lymphadenopathy CARDIAC: RRR, no murmurs, rubs, gallops RESPIRATORY:  Clear to auscultation without rales, wheezing or rhonchi  ABDOMEN: Soft, non-tender, non-distended MUSCULOSKELETAL:  No edema; No deformity  SKIN: Warm and  dry NEUROLOGIC:  Alert and oriented x 3 PSYCHIATRIC:  Normal affect   ASSESSMENT:    1. Coronary artery disease involving native coronary artery of native heart without angina pectoris   2. Palpitations   3. Hyperlipidemia, unspecified hyperlipidemia type   4. Essential hypertension    PLAN:    CAD: Reported chest pain and dyspnea. Echocardiogram 12/2021 showed normal biventricular function, G1DD, no significant valvular disease.  Coronary CTA 12/2021 showed mild nonobstructive CAD, calcium  score 21 (86 percentile). - Continue rosuvastatin  10 mg daily  Palpitations: Description  concerning for arrhythmia, will evaluate with Zio patch x 2 weeks  Hyperlipidemia: On rosuvastatin  10 mg daily.  Check lipid panel  Hypertension: previously on hydrochlorothiazide , but currently not on any antihypertensives and has been normotensive  RTC in 1 year   Medication Adjustments/Labs and Tests Ordered: Current medicines are reviewed at length with the patient today.  Concerns regarding medicines are outlined above.  Orders Placed This Encounter  Procedures   Lipid panel   LONG TERM MONITOR (3-14 DAYS)   EKG 12-Lead   Meds ordered this encounter  Medications   rosuvastatin  (CRESTOR ) 10 MG tablet    Sig: Take 1 tablet (10 mg total) by mouth daily.    Dispense:  90 tablet    Refill:  3    Pt must schedule an overdue appt with either Cardiology or PCP for any more refills. 3rd and FINAL attempt Thank You    Patient Instructions  Medication Instructions:  Continue current medications *If you need a refill on your cardiac medications before your next appointment, please call your pharmacy*  Lab Work: Lipid panel  If you have labs (blood work) drawn today and your tests are completely normal, you will receive your results only by: MyChart Message (if you have MyChart) OR A paper copy in the mail If you have any lab test that is abnormal or we need to change your treatment, we will call you  to review the results.  Testing/Procedures: GEOFFRY HEWS- Long Term Monitor Instructions  Your physician has requested you wear a ZIO patch monitor for 14 days.  This is a single patch monitor. [employer] supplies one patch monitor per enrollment. Additional stickers are not available. Please do not apply patch if you will be having a Nuclear Stress Test,  Echocardiogram, Cardiac CT, MRI, or Chest Xray during the period you would be wearing the  monitor. The patch cannot be worn during these tests. You cannot remove and re-apply the  ZIO XT patch monitor.  Your ZIO patch monitor will be mailed 3 day USPS to your address on file. It may take 3-5 days  to receive your monitor after you have been enrolled.  Once you have received your monitor, please review the enclosed instructions. Your monitor  has already been registered assigning a specific monitor serial # to you.  Billing and Patient Assistance Program Information  We have supplied [employer] with any of your insurance information on file for billing purposes. [employer] offers a sliding scale Patient Assistance Program for patients that do not have  insurance, or whose insurance does not completely cover the cost of the ZIO monitor.  You must apply for the Patient Assistance Program to qualify for this discounted rate.  To apply, please call [employer] at [phone], select option 4, select option 2, ask to apply for  Patient Assistance Program. [name] will ask your household income, and how many people  are in your household. They will quote your out-of-pocket cost based on that information.  [employer] will also be able to set up a 33-month, interest-free payment plan if needed.  Applying the monitor   Shave hair from upper left chest.  Hold abrader disc by orange tab. Rub abrader in 40 strokes over the upper left chest as  indicated in your monitor instructions.  Clean area with 4 enclosed alcohol pads. Let dry.  Apply patch as indicated in  monitor instructions. Patch will be placed under collarbone on left  side of chest with arrow pointing upward.  Rub  patch adhesive wings for 2 minutes. Remove white label marked 1. Remove the white  label marked 2. Rub patch adhesive wings for 2 additional minutes.  While looking in a mirror, press and release button in center of patch. A small green light will  flash 3-4 times. This will be your only indicator that the monitor has been turned on.  Do not shower for the first 24 hours. You may shower after the first 24 hours.  Press the button if you feel a symptom. You will hear a small click. Record Date, Time and  Symptom in the Patient Logbook.  When you are ready to remove the patch, follow instructions on the last 2 pages of Patient  Logbook. Stick patch monitor onto the last page of Patient Logbook.  Place Patient Logbook in the blue and white box. Use locking tab on box and tape box closed  securely. The blue and white box has prepaid postage on it. Please place it in the mailbox as  soon as possible. Your physician should have your test results approximately 7 days after the  monitor has been mailed back to [facility].  Call [facility] Customer Care at [phone] if you have questions regarding  your ZIO XT patch monitor. Call them immediately if you see an orange light blinking on your  monitor.  If your monitor falls off in less than 4 days, contact our Monitor department at [phone].  If your monitor becomes loose or falls off after 4 days call [employer] at [phone] for  suggestions on securing your monitor   Follow-Up: At [facility], you and your health needs are our priority.  As part of our continuing mission to provide you with exceptional heart care, our providers are all part of one team.  This team includes your primary Cardiologist (physician) and Advanced Practice Providers or APPs (Physician Assistants and Nurse Practitioners) who all  work together to provide you with the care you need, when you need it.  Your next appointment:   1 year(s)  Provider:   Dr. Kate We recommend signing up for the patient portal called MyChart.  Sign up information is provided on this After Visit Summary.  MyChart is used to connect with patients for Virtual Visits (Telemedicine).  Patients are able to view lab/test results, encounter notes, upcoming appointments, etc.  Non-urgent messages can be sent to your provider as well.   To learn more about what you can do with MyChart, go to [email or web address].   Other Instructions none       Signed, Lonni LITTIE Kate, MD  11/08/2023 5:19 PM    [facility]

## 2023-11-08 NOTE — Patient Instructions (Addendum)
 Medication Instructions:  Continue current medications *If you need a refill on your cardiac medications before your next appointment, please call your pharmacy*  Lab Work: Lipid panel  If you have labs (blood work) drawn today and your tests are completely normal, you will receive your results only by: MyChart Message (if you have MyChart) OR A paper copy in the mail If you have any lab test that is abnormal or we need to change your treatment, we will call you to review the results.  Testing/Procedures: Crystal Mcintosh- Long Term Monitor Instructions  Your physician has requested you wear a ZIO patch monitor for 14 days.  This is a single patch monitor. [employer] supplies one patch monitor per enrollment. Additional stickers are not available. Please do not apply patch if you will be having a Nuclear Stress Test,  Echocardiogram, Cardiac CT, MRI, or Chest Xray during the period you would be wearing the  monitor. The patch cannot be worn during these tests. You cannot remove and re-apply the  ZIO XT patch monitor.  Your ZIO patch monitor will be mailed 3 day USPS to your address on file. It may take 3-5 days  to receive your monitor after you have been enrolled.  Once you have received your monitor, please review the enclosed instructions. Your monitor  has already been registered assigning a specific monitor serial # to you.  Billing and Patient Assistance Program Information  We have supplied [employer] with any of your insurance information on file for billing purposes. [employer] offers a sliding scale Patient Assistance Program for patients that do not have  insurance, or whose insurance does not completely cover the cost of the ZIO monitor.  You must apply for the Patient Assistance Program to qualify for this discounted rate.  To apply, please call [employer] at [phone], select option 4, select option 2, ask to apply for  Patient Assistance Program. [name] will ask your household income,  and how many people  are in your household. They will quote your out-of-pocket cost based on that information.  [employer] will also be able to set up a 66-month, interest-free payment plan if needed.  Applying the monitor   Shave hair from upper left chest.  Hold abrader disc by orange tab. Rub abrader in 40 strokes over the upper left chest as  indicated in your monitor instructions.  Clean area with 4 enclosed alcohol pads. Let dry.  Apply patch as indicated in monitor instructions. Patch will be placed under collarbone on left  side of chest with arrow pointing upward.  Rub patch adhesive wings for 2 minutes. Remove white label marked 1. Remove the white  label marked 2. Rub patch adhesive wings for 2 additional minutes.  While looking in a mirror, press and release button in center of patch. A small green light will  flash 3-4 times. This will be your only indicator that the monitor has been turned on.  Do not shower for the first 24 hours. You may shower after the first 24 hours.  Press the button if you feel a symptom. You will hear a small click. Record Date, Time and  Symptom in the Patient Logbook.  When you are ready to remove the patch, follow instructions on the last 2 pages of Patient  Logbook. Stick patch monitor onto the last page of Patient Logbook.  Place Patient Logbook in the blue and white box. Use locking tab on box and tape box closed  securely. The blue and white box  has prepaid postage on it. Please place it in the mailbox as  soon as possible. Your physician should have your test results approximately 7 days after the  monitor has been mailed back to [facility].  Call [facility] Customer Care at [phone] if you have questions regarding  your ZIO XT patch monitor. Call them immediately if you see an orange light blinking on your  monitor.  If your monitor falls off in less than 4 days, contact our Monitor department at [phone].  If your monitor  becomes loose or falls off after 4 days call [employer] at [phone] for  suggestions on securing your monitor   Follow-Up: At [facility], you and your health needs are our priority.  As part of our continuing mission to provide you with exceptional heart care, our providers are all part of one team.  This team includes your primary Cardiologist (physician) and Advanced Practice Providers or APPs (Physician Assistants and Nurse Practitioners) who all work together to provide you with the care you need, when you need it.  Your next appointment:   1 year(s)  Provider:   Dr. Kate We recommend signing up for the patient portal called MyChart.  Sign up information is provided on this After Visit Summary.  MyChart is used to connect with patients for Virtual Visits (Telemedicine).  Patients are able to view lab/test results, encounter notes, upcoming appointments, etc.  Non-urgent messages can be sent to your provider as well.   To learn more about what you can do with MyChart, go to [email or web address].   Other Instructions none

## 2023-11-08 NOTE — Progress Notes (Unsigned)
 Enrolled for [employer] to mail a ZIO XT long term holter monitor to the patients address on file.

## 2023-11-11 ENCOUNTER — Other Ambulatory Visit: Payer: Self-pay | Admitting: *Deleted

## 2023-11-11 DIAGNOSIS — I2510 Atherosclerotic heart disease of native coronary artery without angina pectoris: Secondary | ICD-10-CM

## 2023-11-11 LAB — LIPID PANEL
Chol/HDL Ratio: 3.3 ratio (ref 0.0–4.4)
Cholesterol, Total: 160 mg/dL (ref 100–199)
HDL: 48 mg/dL (ref >39)
LDL Chol Calc (NIH): 97 mg/dL (ref 0–99)
Triglycerides: 76 mg/dL (ref 0–149)
VLDL Cholesterol Cal: 15 mg/dL (ref 5–40)

## 2023-11-14 ENCOUNTER — Ambulatory Visit: Payer: Self-pay | Admitting: Cardiology

## 2023-11-22 ENCOUNTER — Encounter: Payer: Self-pay | Admitting: *Deleted

## 2023-11-22 ENCOUNTER — Other Ambulatory Visit: Payer: Self-pay | Admitting: *Deleted

## 2023-11-22 DIAGNOSIS — E785 Hyperlipidemia, unspecified: Secondary | ICD-10-CM

## 2023-11-22 MED ORDER — ROSUVASTATIN CALCIUM 20 MG PO TABS: 20.0000 mg | ORAL_TABLET | Freq: Every day | ORAL | 3 refills | Status: AC

## 2023-12-11 DIAGNOSIS — R002 Palpitations: Secondary | ICD-10-CM

## 2024-02-25 ENCOUNTER — Other Ambulatory Visit: Payer: Self-pay | Admitting: Obstetrics and Gynecology

## 2024-02-25 DIAGNOSIS — Z9189 Other specified personal risk factors, not elsewhere classified: Secondary | ICD-10-CM
